# Patient Record
Sex: Male | Born: 1940 | Race: White | Hispanic: No | Marital: Married | Smoking: Former smoker
Health system: Southern US, Community
[De-identification: ages and names within clinical notes are randomized; demographics above are authoritative.]

## PROBLEM LIST (undated history)

## (undated) DIAGNOSIS — I10 Essential (primary) hypertension: Secondary | ICD-10-CM

## (undated) DIAGNOSIS — C801 Malignant (primary) neoplasm, unspecified: Secondary | ICD-10-CM

## (undated) DIAGNOSIS — N329 Bladder disorder, unspecified: Secondary | ICD-10-CM

## (undated) DIAGNOSIS — E119 Type 2 diabetes mellitus without complications: Secondary | ICD-10-CM

## (undated) DIAGNOSIS — R569 Unspecified convulsions: Secondary | ICD-10-CM

## (undated) HISTORY — DX: Unspecified convulsions: R56.9

## (undated) HISTORY — PX: NOSE SURGERY: SHX723

## (undated) HISTORY — DX: Bladder disorder, unspecified: N32.9

## (undated) HISTORY — PX: CHOLECYSTECTOMY: SHX55

## (undated) HISTORY — DX: Malignant (primary) neoplasm, unspecified: C80.1

---

## 1999-07-01 ENCOUNTER — Other Ambulatory Visit: Admission: RE | Admit: 1999-07-01 | Discharge: 1999-07-01 | Payer: Self-pay | Admitting: Urology

## 2005-03-30 ENCOUNTER — Encounter: Admission: RE | Admit: 2005-03-30 | Discharge: 2005-03-30 | Payer: Self-pay | Admitting: Geriatric Medicine

## 2005-06-20 ENCOUNTER — Ambulatory Visit (HOSPITAL_COMMUNITY): Admission: RE | Admit: 2005-06-20 | Discharge: 2005-06-20 | Payer: Self-pay | Admitting: General Surgery

## 2005-06-20 ENCOUNTER — Encounter (INDEPENDENT_AMBULATORY_CARE_PROVIDER_SITE_OTHER): Payer: Self-pay | Admitting: *Deleted

## 2008-08-11 ENCOUNTER — Encounter: Admission: RE | Admit: 2008-08-11 | Discharge: 2008-08-11 | Payer: Self-pay | Admitting: Gastroenterology

## 2010-12-02 NOTE — Op Note (Signed)
NAME:  Kenneth Gonzalez, Kenneth Gonzalez NO.:  [id number]   MEDICAL RECORD NO.:  [id number]          PATIENT TYPE:  AMB   LOCATION:  SDS                          FACILITY:  [facility]   PHYSICIAN:  Adolph Pollack, M.D.DATE OF BIRTH:  Mar 13, 1941   DATE OF PROCEDURE:  06/20/2005  DATE OF DISCHARGE:                                 OPERATIVE REPORT   PREOP DIAGNOSIS:  Symptomatic cholelithiasis.   POSTOPERATIVE DIAGNOSIS:  Symptomatic cholelithiasis.   PROCEDURE:  Laparoscopic cholecystectomy with intraoperative cholangiogram.   SURGEON:  Adolph Pollack, M.D.   ASSISTANT:  Gita Kudo, M.D.   ANESTHESIA:  General.   INDICATIONS:  This 70 year old male was having these nagging-type of right  upper quadrant pains that have persisted. Initially he thought it just  related to where his dog jumped on him many months ago, but he is still  having the pains. He has gallstones on ultrasound and now presents for  elective laparoscopic cholecystectomy. The procedure and the risks were  discussed with him preoperative.   TECHNIQUE:  He was seen in the holding area and then brought to the  operating room, placed supine on the operating table, and a general  anesthetic was administered. The hair on the abdominal wall was clipped, and  the area was sterilely prepped and draped. A dilute Marcaine solution was  infiltrated in the subumbilical region; and a subumbilical incision was made  through the skin, subcutaneous tissue, fascia, and peritoneum; entering the  peritoneal cavity under direct vision. A pursestring suture of #0 Vicryl was  placed around the fascial edges. A Hassan trocar was introduced into the  peritoneal cavity and a pneumoperitoneum was created by insufflation of CO2  gas.   Next. laparoscope was introduced and he was placed in the reverse  Trendelenburg position with the right side tilted slightly up. An 11-mm  trocar was placed in the epigastric incision and two  5-mm trocars were  placed in the right mid lateral abdomen. The fundus of the gallbladder was  grasped. There were no significant adhesions between the gallbladder, the  omentum, or duodenum. It was able to be retracted toward the right shoulder.  The infundibulum was grasped using blunt dissection, staying on the  gallbladder  The infundibulum was mobilized. I identified the cystic duct  and watched it as it tapered down from the neck of the gallbladder. I  created a window around it. It was clipped at the cystic duct gallbladder  junction and a small incision made in the cystic duct. A cholangiocatheter  was passed through the anterior abdominal wall and placed into the cystic  duct and a cholangiogram was performed.   Under real time fluoroscopy, dilute contrast material was injected into the  cystic duct. The common hepatic, right-and-left hepatic, and common bile  ducts were all opacified fairly promptly and contrast rained into the  duodenum promptly without obvious evidence of obstruction. Final report is  pending the radiologist interpretation.   Following this, I removed the cholangiocatheter and clipped the cystic three  times proximally, and it was divided. I identified an anterior and  posterior  branch of the cystic artery and created windows around these. These were  clipped and divided. I then dissected the gallbladder free from the liver  using electrocautery. A small puncture wound was made in the gallbladder and  small leakage of bile occurred, but I was able to evacuate this. The  gallbladder was placed in the Endopouch bag.   The gallbladder fossa was irrigated copiously with saline solution. The  bleeding points were controlled with electrocautery. It was then hemostatic  without evidence of bile leak, and I evacuated as much of the irrigation  fluid as possible.   The gallbladder was then removed through the subumbilical port. The  subumbilical fascial defect was  closed by tightening up and tying down the  pursestring suture. The remaining trocars were removed and the  pneumoperitoneum was released. The skin incisions were closed with 4-0  Monocryl subcuticular stitches followed by Steri-Strips and sterile  dressings. He tolerated the procedure well without any apparent  complications.  He was taken to the recovery room in satisfactory condition.      Adolph Pollack, M.D.  Electronically Signed     TJR/MEDQ  D:  06/20/2005  T:  06/20/2005  Job:  [id number]   cc:   Hal T. Stoneking, M.D.  Fax: [phone]

## 2013-04-18 ENCOUNTER — Ambulatory Visit (INDEPENDENT_AMBULATORY_CARE_PROVIDER_SITE_OTHER): Payer: Medicare Other

## 2013-04-18 VITALS — BP 127/71 | HR 68 | Resp 16 | Ht 66.0 in | Wt 215.0 lb

## 2013-04-18 DIAGNOSIS — M79609 Pain in unspecified limb: Secondary | ICD-10-CM

## 2013-04-18 DIAGNOSIS — M779 Enthesopathy, unspecified: Secondary | ICD-10-CM

## 2013-04-18 DIAGNOSIS — M79672 Pain in left foot: Secondary | ICD-10-CM

## 2013-04-18 DIAGNOSIS — M19079 Primary osteoarthritis, unspecified ankle and foot: Secondary | ICD-10-CM

## 2013-04-18 DIAGNOSIS — M19072 Primary osteoarthritis, left ankle and foot: Secondary | ICD-10-CM

## 2013-04-18 NOTE — Patient Instructions (Signed)
ICE INSTRUCTIONS  Apply ice or cold pack to the affected area at least 3 times a day for 10-15 minutes each time.  You should also use ice after prolonged activity or vigorous exercise.  Do not apply ice longer than 20 minutes at one time.  Always keep a cloth between your skin and the ice pack to prevent burns.  Being consistent and following these instructions will help control your symptoms.  We suggest you purchase a gel ice pack because they are reusable and do bit leak.  Some of them are designed to wrap around the area.  Use the method that works best for you.  Here are some other suggestions for icing.   Use a frozen bag of peas or corn-inexpensive and molds well to your body, usually stays frozen for 10 to 20 minutes.  Wet a towel with cold water and squeeze out the excess until it's damp.  Place in a bag in the freezer for 20 minutes. Then remove and use.    Alternate applying the warm or hot moist towel for 10 minutes followed by a ice pack for 10-15 minutes to the affected area. Repeat this process 2 or 3 times once or twice daily

## 2013-04-18 NOTE — Progress Notes (Signed)
  Subjective:    Patient ID: Kenneth Gonzalez, male    DOB: 18-Sep-1940, 72 y.o.   MRN: [id number]  HPI Comments: Patient has been wearing custom molded orthotics dispensed on March 07, 2013. He states he is unsure if the orthotics are causing this new pain on the lateral side of the left foot.  Foot Pain This is a new problem. The current episode started 1 to 4 weeks ago. The problem occurs daily. The problem has been gradually worsening. The symptoms are aggravated by walking and standing (hard surfaces). He has tried NSAIDs for the symptoms. The treatment provided mild relief.      Review of Systems  Musculoskeletal: Positive for back pain and gait problem.       Hip pain  All other systems reviewed and are negative.       Objective:   Physical Exam  Constitutional: He is oriented to person, place, and time. He appears well-developed and well-nourished.  HENT:  Head: Normocephalic and atraumatic.  Cardiovascular: Intact distal pulses.   Pulses:      Dorsalis pedis pulses are 2+ on the right side, and 2+ on the left side.       Posterior tibial pulses are 1+ on the right side, and 1+ on the left side.  Skin texture and turgor unremarkable temperature warm, capillary refill time 3-4 seconds all digits, no excessive edema or varicosity noted  Musculoskeletal:       Left foot: He exhibits bony tenderness.  Patient describes tenderness and pain over the fifth metatarsal base and cuboid articulation dorsolateral left midfoot, although no acute injury was noted. Patient does have a history of old ankle injury or sprain which may have affected the metatarsus joint and Lisfranc joint. X-rays demonstrate some asymmetric joint space narrowing no signs of fracture were identified. 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 Patient continues to wear her orthoses with a pocketed first metatarsal  Neurological: He  is alert and oriented to person, place, and time. He has normal strength and normal reflexes. No sensory deficit.  Epicritic and proprioceptive sensations intact muscle strength intact and normal, patient does have some clawfo and almost spastic contracture of the toes notedot digital contractures  Skin: Skin is warm and dry. No cyanosis. Nails show no clubbing.  Hair growth diminished to absent nails slightly criptotic with discoloration. No open wounds abrasions or lacerations were noted. Skin color and pigment normal  Psychiatric: He has a normal mood and affect. His behavior is normal. Judgment and thought content normal.          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 His current shoes are more than 73 years old and show fairly heavy wear. No external edema or erythema or identified and the site. Recommendation at this time is for warm compress ice pack, continue with Mobic or meloxicam NSAID therapy. Maintain use of orthoses. Recommended new shoes with a steel shank and thick soled, patient prefers hightop type working boot or hiking boot which would be preferred. Discussed the possibility of a steroid injection which will be reintroduced if no improvement within a month. Followup visit one month  Alvan Dame DPM

## 2013-12-03 ENCOUNTER — Ambulatory Visit (INDEPENDENT_AMBULATORY_CARE_PROVIDER_SITE_OTHER): Payer: Medicare Other

## 2013-12-03 VITALS — BP 115/70 | HR 78 | Resp 18

## 2013-12-03 DIAGNOSIS — R269 Unspecified abnormalities of gait and mobility: Secondary | ICD-10-CM

## 2013-12-03 DIAGNOSIS — M79672 Pain in left foot: Secondary | ICD-10-CM

## 2013-12-03 DIAGNOSIS — M779 Enthesopathy, unspecified: Secondary | ICD-10-CM

## 2013-12-03 DIAGNOSIS — M79609 Pain in unspecified limb: Secondary | ICD-10-CM

## 2013-12-03 NOTE — Progress Notes (Signed)
   Subjective:    Patient ID: MAVEN VARELAS, male    DOB: 02/03/1941, 73 y.o.   MRN: [id number]  HPI I had a pair of shoes that would throw me off to the side of both my feet and the shoes I have now does not do that and I think the inserts my need to be ajusted at the back near the heels    Review of Systems no systemic changes or findings noted     Objective:   Physical Exam 73 year old white male process this time for followup well-developed well-nourished white [place] and these have some difficulty with his gait indicates his foot is rolling in the swelling is ankles his right parascapular shoes previously tried new balance did elect those either and there were too expensive for sketchers or broken down rolling in laterally on lateral side she was collapsed and it was demonstrated patient shoes are broken down and have to softer the outer sole. The orthotics if she fit and contour well her patient continued to have some Complications patient has a forefoot valgus deformity of the plantar flexed metatarsal others pocketing for the metatarsal he still rolling laterally and I suggested possibly an additional 3 rear foot valgus post is applied to the orthotics and suggested knee shoes such as a new balance or Brooks shoe with a stability or motion control shoe which can be obtained at [employer] or [employer].       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

## 2013-12-03 NOTE — Patient Instructions (Signed)
Recommend a firm stable shoe such as a Brooks or new balance shoe which can be obtained either at home a sports or often running shoe store which now goes by [employer]

## 2014-09-01 ENCOUNTER — Other Ambulatory Visit: Payer: Self-pay | Admitting: Geriatric Medicine

## 2014-09-01 DIAGNOSIS — Z87891 Personal history of nicotine dependence: Secondary | ICD-10-CM

## 2014-09-08 ENCOUNTER — Ambulatory Visit
Admission: RE | Admit: 2014-09-08 | Discharge: 2014-09-08 | Disposition: A | Discharge status: Home / Self Care | Payer: Medicare Other | Source: Ambulatory Visit | Attending: Geriatric Medicine | Admitting: Geriatric Medicine

## 2014-09-08 DIAGNOSIS — Z87891 Personal history of nicotine dependence: Secondary | ICD-10-CM

## 2017-05-14 ENCOUNTER — Ambulatory Visit (INDEPENDENT_AMBULATORY_CARE_PROVIDER_SITE_OTHER): Payer: Medicare Other | Admitting: Orthopaedic Surgery

## 2017-05-14 ENCOUNTER — Encounter (INDEPENDENT_AMBULATORY_CARE_PROVIDER_SITE_OTHER): Payer: Self-pay | Admitting: Orthopaedic Surgery

## 2017-05-14 ENCOUNTER — Ambulatory Visit (INDEPENDENT_AMBULATORY_CARE_PROVIDER_SITE_OTHER): Payer: Medicare Other

## 2017-05-14 VITALS — BP 135/78 | HR 77 | Resp 14 | Ht 69.0 in | Wt 225.0 lb

## 2017-05-14 DIAGNOSIS — M25512 Pain in left shoulder: Secondary | ICD-10-CM | POA: Diagnosis not present

## 2017-05-14 DIAGNOSIS — G8929 Other chronic pain: Secondary | ICD-10-CM | POA: Diagnosis not present

## 2017-05-14 MED ORDER — METHYLPREDNISOLONE ACETATE 40 MG/ML IJ SUSP
80.0000 mg | INTRAMUSCULAR | Status: AC | PRN
  Administered 2017-05-14: 80 mg

## 2017-05-14 MED ORDER — LIDOCAINE HCL 1 % IJ SOLN
2.0000 mL | INTRAMUSCULAR | Status: AC | PRN
  Administered 2017-05-14: 2 mL

## 2017-05-14 MED ORDER — BUPIVACAINE HCL 0.5 % IJ SOLN
2.0000 mL | INTRAMUSCULAR | Status: AC | PRN
  Administered 2017-05-14: 2 mL via INTRA_ARTICULAR

## 2017-05-14 NOTE — Progress Notes (Signed)
Office Visit Note   Patient: Kenneth Gonzalez           Date of Birth: 05/16/1941           MRN: [id number] Visit Date: 05/14/2017              Requested by: Lajean Manes, MD [address] [place], [place] [address] PCP: Lajean Manes, MD   Assessment & Plan: Visit Diagnoses:  1. Chronic left shoulder pain     Plan: Impingement syndrome with possible rotator cuff tear. Long discussion regarding possible diagnosis and treatment options. We'll start with a subacromial cortisone injection and monitor  response  Follow-Up Instructions: No Follow-up on file.   Orders:  Orders Placed This Encounter  Procedures  . XR Shoulder Left   No orders of the defined types were placed in this encounter.     Procedures: Large Joint Inj Date/Time: 05/14/2017 1:20 PM Performed by: Garald Balding Authorized by: Garald Balding   Consent Given by:  Patient Timeout: prior to procedure the correct patient, procedure, and site was verified   Indications:  Pain Location:  Shoulder Site:  L subacromial bursa Prep: patient was prepped and draped in usual sterile fashion   Needle Size:  25 G Needle Length:  1.5 inches Approach:  Lateral Ultrasound Guidance: No   Fluoroscopic Guidance: No   Arthrogram: No   Medications:  80 mg methylPREDNISolone acetate 40 MG/ML; 2 mL lidocaine 1 %; 2 mL bupivacaine 0.5 % Aspiration Attempted: No   Patient tolerance:  Patient tolerated the procedure well with no immediate complications     Clinical Data: No additional findings.   Subjective: Chief Complaint  Patient presents with  . Left Shoulder - Pain, Numbness    Kenneth Gonzalez is a 76 y o that is here for Left shoulder pain x 3 months.  Insidious onset of left shoulder pain without injury or trauma. Now having difficulty raising his arm over his head and sleeping on that side. No numbness or tingling. Pain is localized to the shoulder. No neck discomfort. No ecchymosis or  induration.  HPI  Review of Systems  Constitutional: Negative for fatigue.  HENT: Negative for hearing loss.   Respiratory: Negative for apnea, chest tightness and shortness of breath.   Cardiovascular: Negative for chest pain, palpitations and leg swelling.  Gastrointestinal: Negative for blood in stool, constipation and diarrhea.  Genitourinary: Negative for difficulty urinating.  Musculoskeletal: Positive for back pain. Negative for arthralgias, joint swelling, myalgias, neck pain and neck stiffness.  Neurological: Negative for weakness, numbness and headaches.  Hematological: Does not bruise/bleed easily.  Psychiatric/Behavioral: Negative for sleep disturbance. The patient is not nervous/anxious.      Objective: Vital Signs: BP 135/78   Pulse 77   Resp 14   Ht 5\' 9"  (1.753 m)   Wt 225 lb (102.1 kg)   BMI 33.23 kg/m   Physical Exam  Ortho Exam left shoulder with positive impingement tickly with external rotation. Positive empty can testing. Biceps intact. Skin intact. Good grip and release. Neurovascular exam intact. Able to place arm fully overhead with her circuitous range of motion and painful arc  Specialty Comments:  No specialty comments available.  Imaging: Xr Shoulder Left  Result Date: 05/14/2017 Films of the left shoulder obtained in several projections. The humeral head is centered about the glenoid. No ectopic calcification. Normal space between the humeral head and the acromion. Mild to moderate degenerative changes at the acromioclavicular  joint with this cystic changes on both sides. Type I acromion    PMFS History: There are no active problems to display for this patient.  Past Medical History:  Diagnosis Date  . Bladder problem   . Cancer ([place])    skin  . Seizures ([place])     History reviewed. No pertinent family history.  Past Surgical History:  Procedure Laterality Date  . CHOLECYSTECTOMY    . NOSE SURGERY     Social History    Occupational History  . Not on file.   Social History Main Topics  . Smoking status: Former Research scientist (life sciences)  . Smokeless tobacco: Never Used  . Alcohol use No  . Drug use: No  . Sexual activity: Not on file

## 2017-05-28 ENCOUNTER — Ambulatory Visit (INDEPENDENT_AMBULATORY_CARE_PROVIDER_SITE_OTHER): Payer: Medicare Other | Admitting: Orthopaedic Surgery

## 2017-09-17 ENCOUNTER — Other Ambulatory Visit: Payer: Self-pay

## 2017-09-17 ENCOUNTER — Encounter (HOSPITAL_COMMUNITY): Payer: Self-pay

## 2017-09-17 ENCOUNTER — Emergency Department (HOSPITAL_COMMUNITY)
Admission: EM | Admit: 2017-09-17 | Discharge: 2017-09-17 | Disposition: A | Discharge status: Home / Self Care | Payer: Medicare Other | Attending: Emergency Medicine | Admitting: Emergency Medicine

## 2017-09-17 ENCOUNTER — Emergency Department (HOSPITAL_COMMUNITY): Payer: Medicare Other

## 2017-09-17 DIAGNOSIS — Z87891 Personal history of nicotine dependence: Secondary | ICD-10-CM | POA: Insufficient documentation

## 2017-09-17 DIAGNOSIS — Z85828 Personal history of other malignant neoplasm of skin: Secondary | ICD-10-CM | POA: Insufficient documentation

## 2017-09-17 DIAGNOSIS — R0789 Other chest pain: Secondary | ICD-10-CM | POA: Insufficient documentation

## 2017-09-17 DIAGNOSIS — Z79899 Other long term (current) drug therapy: Secondary | ICD-10-CM | POA: Diagnosis not present

## 2017-09-17 DIAGNOSIS — R079 Chest pain, unspecified: Secondary | ICD-10-CM | POA: Diagnosis present

## 2017-09-17 LAB — BASIC METABOLIC PANEL
ANION GAP: 12 (ref 5–15)
BUN: 12 mg/dL (ref 6–20)
CALCIUM: 9.9 mg/dL (ref 8.9–10.3)
CO2: 27 mmol/L (ref 22–32)
Chloride: 102 mmol/L (ref 101–111)
Creatinine, Ser: 0.96 mg/dL (ref 0.61–1.24)
GFR calc Af Amer: >60 mL/min (ref >60)
GFR calc non Af Amer: >60 mL/min (ref >60)
GLUCOSE: 125 mg/dL — AB (ref 65–99)
Potassium: 4.1 mmol/L (ref 3.5–5.1)
Sodium: 141 mmol/L (ref 135–145)

## 2017-09-17 LAB — CBC
HEMATOCRIT: 43.3 % (ref 39.0–52.0)
HEMOGLOBIN: 14.7 g/dL (ref 13.0–17.0)
MCH: 29.7 pg (ref 26.0–34.0)
MCHC: 33.9 g/dL (ref 30.0–36.0)
MCV: 87.5 fL (ref 78.0–100.0)
Platelets: 252 10*3/uL (ref 150–400)
RBC: 4.95 MIL/uL (ref 4.22–5.81)
RDW: 12.9 % (ref 11.5–15.5)
WBC: 8.1 10*3/uL (ref 4.0–10.5)

## 2017-09-17 LAB — I-STAT TROPONIN, ED
TROPONIN I, POC: 0 ng/mL (ref 0.00–0.08)
Troponin i, poc: 0 ng/mL (ref 0.00–0.08)

## 2017-09-17 LAB — D-DIMER, QUANTITATIVE (NOT AT ARMC): D DIMER QUANT: 0.27 ug{FEU}/mL (ref 0.00–0.50)

## 2017-09-17 NOTE — ED Triage Notes (Signed)
Patient complains of sharp CP that started last night and got some relief with tums. Reports that this am the pain returned, no associated symptoms, no OSB

## 2017-09-17 NOTE — Discharge Instructions (Signed)
Avoid any strenuous activity or heavy lifting with your right arm. Take tylenol or motrin for pain. Follow up with family doctor as needed. Return if worsening.

## 2017-09-17 NOTE — ED Provider Notes (Signed)
[facility] EMERGENCY DEPARTMENT Provider Note   CSN: [id number] Arrival date & time: 09/17/17  1258     History   Chief Complaint No chief complaint on file.   HPI Kenneth Gonzalez is a 77 y.o. male.  HPI Kenneth Gonzalez is a 77 y.o. male with history of high cholesterol, seizures, presents to emergency department complaining of right-sided chest pain.  Patient states his pain began yesterday.  He states pain comes and goes in severity, at times it sharp, at times it is dull.  It is worsened by palpation of the chest and taking deep breaths.  He denies any exertional chest pain or shortness of breath.  Denies any dizziness or lightheadedness.  No nausea or vomiting.  Pain is not worsened with eating.  He states he tried some Tums last night and it did seem to help.  Denies any strenuous activity or heavy lifting with the right arm.  Patient is left-handed.  No recent travel or surgeries.  No history of cardiac problems or blood clots.  Pain does not radiate.  Past Medical History:  Diagnosis Date  . Bladder problem   . Cancer ([place])    skin  . Seizures ([place])     There are no active problems to display for this patient.   Past Surgical History:  Procedure Laterality Date  . CHOLECYSTECTOMY    . NOSE SURGERY         Home Medications    Prior to Admission medications   Medication Sig Start Date End Date Taking? Authorizing Provider  Fexofenadine HCl (ALLEGRA PO) Take by mouth.    [provider]  Glucosamine-Chondroit-Vit C-Mn (GLUCOSAMINE CHONDR 1500 COMPLX PO) Take by mouth.    [provider]  meloxicam (MOBIC) 15 MG tablet Take 15 mg by mouth daily.    Harriet Masson, DPM  metFORMIN (GLUCOPHAGE-XR) 500 MG 24 hr tablet  12/01/13   [provider]  phenytoin (DILANTIN) 100 MG ER capsule Take 100 mg by mouth 3 (three) times daily.    [provider]  pravastatin (PRAVACHOL) 10 MG tablet  11/05/13   [provider]    Family History No family history on file.  Social History Social History   Tobacco Use  . Smoking status: Former Research scientist (life sciences)  . Smokeless tobacco: Never Used  Substance Use Topics  . Alcohol use: No  . Drug use: No     Allergies   Patient has no known allergies.   Review of Systems Review of Systems  Constitutional: Negative for chills and fever.  Respiratory: Negative for cough, chest tightness and shortness of breath.   Cardiovascular: Positive for chest pain. Negative for palpitations and leg swelling.  Gastrointestinal: Negative for abdominal distention, abdominal pain, diarrhea, nausea and vomiting.  Genitourinary: Negative for dysuria, frequency, hematuria and urgency.  Musculoskeletal: Negative for arthralgias, myalgias, neck pain and neck stiffness.  Skin: Negative for rash.  Allergic/Immunologic: Negative for immunocompromised state.  Neurological: Negative for dizziness, weakness, light-headedness, numbness and headaches.  All other systems reviewed and are negative.    Physical Exam Updated Vital Signs BP (!) 149/86   Pulse 77   Temp 98.1 F (36.7 C) (Oral)   Resp 18   SpO2 98%   Physical Exam  Constitutional: He appears well-developed and well-nourished. No distress.  HENT:  Head: Normocephalic and atraumatic.  Eyes: Conjunctivae are normal.  Neck: Neck supple.  Cardiovascular: Normal rate, regular rhythm and normal heart sounds.  Pulmonary/Chest:  Effort normal. No respiratory distress. He has no wheezes. He has no rales. He exhibits tenderness.  TTp over right chest wall. No rashes, bruising, discoloraiton    Abdominal: Soft. Bowel sounds are normal. He exhibits no distension. There is no tenderness. There is no rebound.  Musculoskeletal: He exhibits no edema.  Neurological: He is alert.  Skin: Skin is warm and dry.  Nursing note and vitals reviewed.    ED Treatments / Results  Labs (all labs ordered are listed, but only  abnormal results are displayed) Labs Reviewed  BASIC METABOLIC PANEL - Abnormal; Notable for the following components:      Result Value   Glucose, Bld 125 (*)    All other components within normal limits  CBC  D-DIMER, QUANTITATIVE (NOT AT [facility])  I-STAT TROPONIN, ED  I-STAT TROPONIN, ED  I-STAT TROPONIN, ED    EKG  EKG Interpretation  Date/Time:  Monday September 17 2017 13:06:50 EST Ventricular Rate:  79 PR Interval:  176 QRS Duration: 98 QT Interval:  408 QTC Calculation: 467 R Axis:   60 Text Interpretation:  Normal sinus rhythm Normal ECG Confirmed by Davonna Belling [phone]) on 09/17/2017 8:44:17 PM       Radiology Dg Chest 2 View  Result Date: 09/17/2017 CLINICAL DATA:  Chest pain starting last night radiating to RIGHT arm. Former smoker. EXAM: CHEST  2 VIEW COMPARISON:  Chest radiograph June 08, 2017 FINDINGS: Cardiomediastinal silhouette is normal. No pleural effusions or focal consolidations. LEFT midlung zone scarring. Trachea projects midline and there is no pneumothorax. Soft tissue planes and included osseous structures are non-suspicious. Mild degenerative change of the thoracic spine. Surgical clips in the included right abdomen compatible with cholecystectomy. Mild RIGHT diaphragmatic hepatic eventration. IMPRESSION: Stable examination: No acute cardiopulmonary process. Electronically Signed   By: Elon Alas M.D.   On: 09/17/2017 15:08    Procedures Procedures (including critical care time)  Medications Ordered in ED Medications - No data to display   Initial Impression / Assessment and Plan / ED Course  I have reviewed the triage vital signs and the nursing notes.  Pertinent labs & imaging results that were available during my care of the patient were reviewed by me and considered in my medical decision making (see chart for details).     Pt with right sided chest pain, reproducible with deep breathing and palpation of the chest wall. VS normal.  Pt in NAD. Labs obtained in triage 6 hrs ago, normal CBC, BMET, trop. ECG normal. CXR normal. Will add  A d dimer and delta trop.   9:48 PM Second troponin is 0.00.  D-dimer is negative at 0.27.  Patient is in no acute distress, patient states he just remembered that he sleeps on the right side and states it is worse when he is laying on that side at this time.  His pain again is reproducible with palpation of the right side of the chest.  He is not short of breath.  No widened mediastinum on the x-ray, do not think this is a dissection.  Very atypical pain for ACS.  Plan to discharge home, advised to take Tylenol or Motrin for the pain, and follow-up with family doctor closely.  Return precautions discussed.  Vitals:   09/17/17 1306 09/17/17 1550 09/17/17 1824  BP: (!) 174/90 (!) 154/80 (!) 149/86  Pulse: 78 70 77  Resp: 18 18 18   Temp: 98.2 F (36.8 C) 98 F (36.7 C) 98.1 F (36.7 C)  TempSrc:  Oral Oral Oral  SpO2: 99% 98% 98%     Final Clinical Impressions(s) / ED Diagnoses   Final diagnoses:  Chest wall pain    ED Discharge Orders    None       Janee Morn 09/17/17 2149    Davonna Belling, MD 09/17/17 2255

## 2017-11-19 ENCOUNTER — Ambulatory Visit (INDEPENDENT_AMBULATORY_CARE_PROVIDER_SITE_OTHER): Payer: Self-pay

## 2017-11-19 ENCOUNTER — Ambulatory Visit (INDEPENDENT_AMBULATORY_CARE_PROVIDER_SITE_OTHER): Payer: Medicare Other | Admitting: Orthopaedic Surgery

## 2017-11-19 ENCOUNTER — Encounter (INDEPENDENT_AMBULATORY_CARE_PROVIDER_SITE_OTHER): Payer: Self-pay | Admitting: Orthopaedic Surgery

## 2017-11-19 VITALS — BP 128/74 | HR 75 | Resp 17 | Ht 66.0 in | Wt 205.0 lb

## 2017-11-19 DIAGNOSIS — M545 Low back pain: Secondary | ICD-10-CM | POA: Diagnosis not present

## 2017-11-19 DIAGNOSIS — M25551 Pain in right hip: Secondary | ICD-10-CM | POA: Diagnosis not present

## 2017-11-19 NOTE — Patient Instructions (Signed)

## 2017-11-19 NOTE — Progress Notes (Signed)
Office Visit Note   Patient: Kenneth Gonzalez           Date of Birth: [birth year]           MRN: [id number] Visit Date: 11/19/2017              Requested by: Lajean Manes, MD [address] [place], [place] [address] PCP: Lajean Manes, MD   Assessment & Plan: Visit Diagnoses:  1. Pain in right hip     Plan: Right hip pain etiology appears to be from the lumbar spine where there is diffuse degenerative changes.  Presently feeling much better.  We will give him a set of lumbar spine exercises.  Consider over-the-counter medicines.  Office if he has an exacerbation.  Follow-Up Instructions: No follow-ups on file.   Orders:  Orders Placed This Encounter  Procedures  . XR HIP UNILAT W OR W/O PELVIS 2-3 VIEWS RIGHT  . XR Lumbar Spine 2-3 Views   No orders of the defined types were placed in this encounter.     Procedures: No procedures performed   Clinical Data: No additional findings.   Subjective: Chief Complaint  Patient presents with  . Right Hip - Pain  Mr. Gause had an episode last week with right paralumbar pain radiating to his buttock and the lateral aspect of his right thigh.  No history of injury or trauma.  He did try some over-the-counter medicines and some "stretching" he is now feeling much better.  Not having any groin pain.  Notes that he is not walking with a limp.  HPI  Review of Systems  Constitutional: Negative for fatigue and fever.  HENT: Negative for sneezing and sore throat.   Eyes: Negative for pain.  Respiratory: Negative for shortness of breath.   Cardiovascular: Negative for chest pain.  Gastrointestinal: Negative for abdominal pain and constipation.  Endocrine: Negative for polyuria.  Genitourinary: Negative for flank pain.  Musculoskeletal: Positive for gait problem. Negative for joint swelling.  Skin: Negative for rash.  Allergic/Immunologic: Negative for food allergies.  Neurological: Negative for dizziness and  headaches.  Hematological: Does not bruise/bleed easily.  Psychiatric/Behavioral: Negative for confusion. The patient is not nervous/anxious.      Objective: Vital Signs: BP 128/74 (BP Location: Left Arm, Patient Position: Sitting, Cuff Size: Normal)   Pulse 75   Resp 17   Ht 5\' 6"  (1.676 m)   Wt 205 lb (93 kg) Comment: per patient  BMI 33.09 kg/m   Physical Exam  Constitutional: He is oriented to person, place, and time. He appears well-developed and well-nourished.  HENT:  Mouth/Throat: Oropharynx is clear and moist.  Eyes: Pupils are equal, round, and reactive to light. EOM are normal.  Pulmonary/Chest: Effort normal.  Neurological: He is alert and oriented to person, place, and time.  Skin: Skin is warm and dry.  Psychiatric: He has a normal mood and affect. His behavior is normal.    Ortho Exam awake alert and oriented x3.  Comfortable sitting.  Easily able to get up and down from a sitting position to walk without a limp.  Some mild tenderness in the right paralumbar region.  No pain with range of motion of the groin.  Vascular exam intact.  No pain around the lateral aspect of his right hip.  Straight leg raise negative bilaterally.  Motor exam intact  Specialty Comments:  No specialty comments available.  Imaging: Xr Hip Unilat W Or W/o Pelvis 2-3 Views Right  Result Date: 11/19/2017 AP the pelvis demonstrated no significant arthritis of either hip.  Sacroiliac joints appear to be intact.  Xr Lumbar Spine 2-3 Views  Result Date: 11/19/2017 2 views of the lumbar spine were obtained in the AP and lateral projections.  Spine is straight there is some bony demineralization.  Lateral view reveals significant degenerative changes throughout the lumbar spine with spurring anteriorly and calcification of the anterior longitudinal ligament.  Mild calcification of the abdominal aorta.  Mild decrease in the disc space between L5 and S1.  Facet sclerosis at L4-5 and L5-S1.  No  listhesis.    PMFS History: There are no active problems to display for this patient.  Past Medical History:  Diagnosis Date  . Bladder problem   . Cancer ([place])    skin  . Seizures ([place])     History reviewed. No pertinent family history.  Past Surgical History:  Procedure Laterality Date  . CHOLECYSTECTOMY    . NOSE SURGERY     Social History   Occupational History  . Not on file  Tobacco Use  . Smoking status: Former Research scientist (life sciences)  . Smokeless tobacco: Never Used  Substance and Sexual Activity  . Alcohol use: No  . Drug use: No  . Sexual activity: Not on file

## 2019-12-30 DIAGNOSIS — J3089 Other allergic rhinitis: Secondary | ICD-10-CM | POA: Diagnosis not present

## 2019-12-30 DIAGNOSIS — R519 Headache, unspecified: Secondary | ICD-10-CM | POA: Diagnosis not present

## 2019-12-30 DIAGNOSIS — M542 Cervicalgia: Secondary | ICD-10-CM | POA: Diagnosis not present

## 2019-12-30 DIAGNOSIS — R030 Elevated blood-pressure reading, without diagnosis of hypertension: Secondary | ICD-10-CM | POA: Diagnosis not present

## 2020-02-09 DIAGNOSIS — E1142 Type 2 diabetes mellitus with diabetic polyneuropathy: Secondary | ICD-10-CM | POA: Diagnosis not present

## 2020-02-09 DIAGNOSIS — Z7984 Long term (current) use of oral hypoglycemic drugs: Secondary | ICD-10-CM | POA: Diagnosis not present

## 2020-02-09 DIAGNOSIS — I10 Essential (primary) hypertension: Secondary | ICD-10-CM | POA: Diagnosis not present

## 2020-02-09 DIAGNOSIS — R0789 Other chest pain: Secondary | ICD-10-CM | POA: Diagnosis not present

## 2020-03-16 DIAGNOSIS — Z7984 Long term (current) use of oral hypoglycemic drugs: Secondary | ICD-10-CM | POA: Diagnosis not present

## 2020-03-16 DIAGNOSIS — E1142 Type 2 diabetes mellitus with diabetic polyneuropathy: Secondary | ICD-10-CM | POA: Diagnosis not present

## 2020-03-16 DIAGNOSIS — I10 Essential (primary) hypertension: Secondary | ICD-10-CM | POA: Diagnosis not present

## 2020-03-16 DIAGNOSIS — E1169 Type 2 diabetes mellitus with other specified complication: Secondary | ICD-10-CM | POA: Diagnosis not present

## 2020-04-26 DIAGNOSIS — I10 Essential (primary) hypertension: Secondary | ICD-10-CM | POA: Diagnosis not present

## 2020-04-26 DIAGNOSIS — Z23 Encounter for immunization: Secondary | ICD-10-CM | POA: Diagnosis not present

## 2020-04-26 DIAGNOSIS — Z79899 Other long term (current) drug therapy: Secondary | ICD-10-CM | POA: Diagnosis not present

## 2020-04-26 DIAGNOSIS — E1142 Type 2 diabetes mellitus with diabetic polyneuropathy: Secondary | ICD-10-CM | POA: Diagnosis not present

## 2020-04-26 DIAGNOSIS — Z7984 Long term (current) use of oral hypoglycemic drugs: Secondary | ICD-10-CM | POA: Diagnosis not present

## 2020-04-26 DIAGNOSIS — E1169 Type 2 diabetes mellitus with other specified complication: Secondary | ICD-10-CM | POA: Diagnosis not present

## 2020-07-13 DIAGNOSIS — I10 Essential (primary) hypertension: Secondary | ICD-10-CM | POA: Diagnosis not present

## 2020-07-13 DIAGNOSIS — E1142 Type 2 diabetes mellitus with diabetic polyneuropathy: Secondary | ICD-10-CM | POA: Diagnosis not present

## 2020-07-13 DIAGNOSIS — Z79899 Other long term (current) drug therapy: Secondary | ICD-10-CM | POA: Diagnosis not present

## 2020-07-13 DIAGNOSIS — K521 Toxic gastroenteritis and colitis: Secondary | ICD-10-CM | POA: Diagnosis not present

## 2020-07-13 DIAGNOSIS — Z7984 Long term (current) use of oral hypoglycemic drugs: Secondary | ICD-10-CM | POA: Diagnosis not present

## 2020-10-25 DIAGNOSIS — E1169 Type 2 diabetes mellitus with other specified complication: Secondary | ICD-10-CM | POA: Diagnosis not present

## 2020-10-25 DIAGNOSIS — Z79899 Other long term (current) drug therapy: Secondary | ICD-10-CM | POA: Diagnosis not present

## 2020-10-25 DIAGNOSIS — G40909 Epilepsy, unspecified, not intractable, without status epilepticus: Secondary | ICD-10-CM | POA: Diagnosis not present

## 2020-10-25 DIAGNOSIS — E782 Mixed hyperlipidemia: Secondary | ICD-10-CM | POA: Diagnosis not present

## 2020-10-25 DIAGNOSIS — E1142 Type 2 diabetes mellitus with diabetic polyneuropathy: Secondary | ICD-10-CM | POA: Diagnosis not present

## 2020-10-25 DIAGNOSIS — I10 Essential (primary) hypertension: Secondary | ICD-10-CM | POA: Diagnosis not present

## 2020-10-25 DIAGNOSIS — Z0000 Encounter for general adult medical examination without abnormal findings: Secondary | ICD-10-CM | POA: Diagnosis not present

## 2020-10-25 DIAGNOSIS — Z1389 Encounter for screening for other disorder: Secondary | ICD-10-CM | POA: Diagnosis not present

## 2020-10-28 DIAGNOSIS — H353131 Nonexudative age-related macular degeneration, bilateral, early dry stage: Secondary | ICD-10-CM | POA: Diagnosis not present

## 2020-10-28 DIAGNOSIS — E119 Type 2 diabetes mellitus without complications: Secondary | ICD-10-CM | POA: Diagnosis not present

## 2020-10-28 DIAGNOSIS — H43813 Vitreous degeneration, bilateral: Secondary | ICD-10-CM | POA: Diagnosis not present

## 2020-10-28 DIAGNOSIS — H524 Presbyopia: Secondary | ICD-10-CM | POA: Diagnosis not present

## 2021-04-06 DIAGNOSIS — E1169 Type 2 diabetes mellitus with other specified complication: Secondary | ICD-10-CM | POA: Diagnosis not present

## 2021-04-06 DIAGNOSIS — I10 Essential (primary) hypertension: Secondary | ICD-10-CM | POA: Diagnosis not present

## 2021-04-06 DIAGNOSIS — E1142 Type 2 diabetes mellitus with diabetic polyneuropathy: Secondary | ICD-10-CM | POA: Diagnosis not present

## 2021-04-06 DIAGNOSIS — Z23 Encounter for immunization: Secondary | ICD-10-CM | POA: Diagnosis not present

## 2021-05-18 DIAGNOSIS — L821 Other seborrheic keratosis: Secondary | ICD-10-CM | POA: Diagnosis not present

## 2021-05-18 DIAGNOSIS — L820 Inflamed seborrheic keratosis: Secondary | ICD-10-CM | POA: Diagnosis not present

## 2021-05-18 DIAGNOSIS — D225 Melanocytic nevi of trunk: Secondary | ICD-10-CM | POA: Diagnosis not present

## 2021-05-18 DIAGNOSIS — L298 Other pruritus: Secondary | ICD-10-CM | POA: Diagnosis not present

## 2021-05-18 DIAGNOSIS — L814 Other melanin hyperpigmentation: Secondary | ICD-10-CM | POA: Diagnosis not present

## 2021-05-18 DIAGNOSIS — L570 Actinic keratosis: Secondary | ICD-10-CM | POA: Diagnosis not present

## 2021-05-18 DIAGNOSIS — L538 Other specified erythematous conditions: Secondary | ICD-10-CM | POA: Diagnosis not present

## 2021-05-18 DIAGNOSIS — D492 Neoplasm of unspecified behavior of bone, soft tissue, and skin: Secondary | ICD-10-CM | POA: Diagnosis not present

## 2021-06-16 DIAGNOSIS — Z03818 Encounter for observation for suspected exposure to other biological agents ruled out: Secondary | ICD-10-CM | POA: Diagnosis not present

## 2021-06-16 DIAGNOSIS — J069 Acute upper respiratory infection, unspecified: Secondary | ICD-10-CM | POA: Diagnosis not present

## 2021-10-05 ENCOUNTER — Emergency Department (HOSPITAL_COMMUNITY): Payer: Medicare PPO

## 2021-10-05 ENCOUNTER — Emergency Department (HOSPITAL_COMMUNITY)
Admission: EM | Admit: 2021-10-05 | Discharge: 2021-10-05 | Disposition: A | Discharge status: Home / Self Care | Payer: Medicare PPO | Attending: Emergency Medicine | Admitting: Emergency Medicine

## 2021-10-05 ENCOUNTER — Encounter (HOSPITAL_COMMUNITY): Payer: Self-pay | Admitting: Emergency Medicine

## 2021-10-05 ENCOUNTER — Other Ambulatory Visit: Payer: Self-pay

## 2021-10-05 DIAGNOSIS — E1142 Type 2 diabetes mellitus with diabetic polyneuropathy: Secondary | ICD-10-CM | POA: Diagnosis not present

## 2021-10-05 DIAGNOSIS — Z7984 Long term (current) use of oral hypoglycemic drugs: Secondary | ICD-10-CM | POA: Diagnosis not present

## 2021-10-05 DIAGNOSIS — R0789 Other chest pain: Secondary | ICD-10-CM

## 2021-10-05 DIAGNOSIS — E1169 Type 2 diabetes mellitus with other specified complication: Secondary | ICD-10-CM | POA: Diagnosis not present

## 2021-10-05 DIAGNOSIS — I10 Essential (primary) hypertension: Secondary | ICD-10-CM | POA: Insufficient documentation

## 2021-10-05 DIAGNOSIS — G40909 Epilepsy, unspecified, not intractable, without status epilepticus: Secondary | ICD-10-CM | POA: Diagnosis not present

## 2021-10-05 DIAGNOSIS — E119 Type 2 diabetes mellitus without complications: Secondary | ICD-10-CM | POA: Insufficient documentation

## 2021-10-05 DIAGNOSIS — R079 Chest pain, unspecified: Secondary | ICD-10-CM

## 2021-10-05 DIAGNOSIS — E782 Mixed hyperlipidemia: Secondary | ICD-10-CM | POA: Diagnosis not present

## 2021-10-05 HISTORY — DX: Essential (primary) hypertension: I10

## 2021-10-05 HISTORY — DX: Type 2 diabetes mellitus without complications: E11.9

## 2021-10-05 LAB — CBC
HCT: 42.2 % (ref 39.0–52.0)
Hemoglobin: 14.4 g/dL (ref 13.0–17.0)
MCH: 30.2 pg (ref 26.0–34.0)
MCHC: 34.1 g/dL (ref 30.0–36.0)
MCV: 88.5 fL (ref 80.0–100.0)
Platelets: 250 10*3/uL (ref 150–400)
RBC: 4.77 MIL/uL (ref 4.22–5.81)
RDW: 12.4 % (ref 11.5–15.5)
WBC: 7 10*3/uL (ref 4.0–10.5)
nRBC: 0 % (ref 0.0–0.2)

## 2021-10-05 LAB — BASIC METABOLIC PANEL
Anion gap: 9 (ref 5–15)
BUN: 15 mg/dL (ref 8–23)
CO2: 28 mmol/L (ref 22–32)
Calcium: 9.3 mg/dL (ref 8.9–10.3)
Chloride: 104 mmol/L (ref 98–111)
Creatinine, Ser: 0.99 mg/dL (ref 0.61–1.24)
GFR, Estimated: >60 mL/min (ref >60)
Glucose, Bld: 237 mg/dL — ABNORMAL HIGH (ref 70–99)
Potassium: 4.1 mmol/L (ref 3.5–5.1)
Sodium: 141 mmol/L (ref 135–145)

## 2021-10-05 LAB — TROPONIN I (HIGH SENSITIVITY)
Troponin I (High Sensitivity): 5 ng/L (ref <18)
Troponin I (High Sensitivity): 6 ng/L (ref <18)

## 2021-10-05 MED ORDER — ASPIRIN 81 MG PO CHEW
324.0000 mg | CHEWABLE_TABLET | Freq: Once | ORAL | Status: AC
  Administered 2021-10-05: 324 mg via ORAL
  Filled 2021-10-05: qty 4

## 2021-10-05 NOTE — Consult Note (Addendum)
?Cardiology Consultation:  ? ?Patient ID: Kenneth Gonzalez ?MRN: [id number]; DOB: 1941-07-11 ? ?Admit date: 10/05/2021 ?Date of Consult: 10/05/2021 ? ?PCP:  Kenneth Manes, MD ?  ?[facility] Providers ?Cardiologist:  None    ? ?Patient Profile:  ? ?Kenneth Gonzalez is a 81 y.o. male with a hx of HTN, DM, seizures who is being seen 10/05/2021 for the evaluation of chest pain at the request of Dr. Doren Custard. ? ?History of Present Illness:  ? ?Kenneth Gonzalez is na 81 year old male with above medical history who does not have any cardiac history. Has never been seen by a cardiologist or had any ischemic evaluations in the past.  ? ?Patient presented to his PCP on 3/22 complaining of left sided chest discomfort for the past 3 days. His PCP took and EKG that was reported to have t-wave inversions in the precordial leads. However, when reviewed only showed T-wave inversions in leads V1 and V2. EKG in the ED showed normal sinus rhythm, no ST or T wave abnormalities. Initial hsTn 5. CXR showed no acute findings.  ? ?On interview, patient reports that he has had intermittent chest pain for the past 2-3 days. 3 Days ago, patient was moving heavy cement statues. After moving the statues, he felt a sharp pain in the left side of his chest. Pain went away, but returned a day later. When the pain returned, it felt like a torn muscle and was worse when he lifted his arm above his head. Occasionally radiates to his back. Today, he does not have chest pain at rest. Pain is reproduced when he lifts his arm above his head. Denies any SOB. Denies any recent chest pain on exertion. Denies smoking cigarettes since he quit in 1985. Does not drink alcohol. Has diabetes that is well controlled. Also has HTN, HLD.  ? ? ?Past Medical History:  ?Diagnosis Date  ? Bladder problem   ? Cancer [facility])   ? skin  ? Diabetes mellitus without complication ([place])   ? Hypertension   ? Seizures ([place])   ? ? ?Past Surgical History:  ?Procedure Laterality Date  ?  CHOLECYSTECTOMY    ? NOSE SURGERY    ?  ? ?Home Medications:  ?Prior to Admission medications   ?Medication Sig Start Date End Date Taking? Authorizing Provider  ?Glucosamine-Chondroit-Vit C-Mn (GLUCOSAMINE CHONDR 1500 COMPLX PO) Take 1 tablet by mouth 2 (two) times daily.   Yes [provider]  ?lisinopril (ZESTRIL) 5 MG tablet Take 5 mg by mouth daily. 10/02/21  Yes [provider]  ?metFORMIN (GLUCOPHAGE-XR) 500 MG 24 hr tablet Take 500 mg by mouth daily. 12/01/13  Yes [provider]  ?montelukast (SINGULAIR) 10 MG tablet Take 1 tablet by mouth daily. 08/11/21  Yes [provider]  ?Multiple Vitamins-Minerals (PRESERVISION AREDS 2 PO) Take 1 capsule by mouth 2 (two) times daily.   Yes [provider]  ?OVER THE COUNTER MEDICATION Take 2 capsules by mouth 2 (two) times daily. Omega XL   Yes [provider]  ?OVER THE COUNTER MEDICATION Apply 1 application. topically daily as needed (pain). Hemp cream   Yes [provider]  ?phenytoin (DILANTIN) 100 MG ER capsule Take 200 mg by mouth at bedtime.   Yes [provider]  ?pravastatin (PRAVACHOL) 20 MG tablet Take 20 mg by mouth daily.   Yes [provider]  ? ? ?Inpatient Medications: ?Scheduled Meds: ? ?Continuous Infusions: ? ?PRN Meds: ? ? ?Allergies:   No Known  Allergies ? ?Social History:   ?Social History  ? ?Socioeconomic History  ? Marital status: Married  ?  Spouse name: Not on file  ? Number of children: Not on file  ? Years of education: Not on file  ? Highest education level: Not on file  ?Occupational History  ? Not on file  ?Tobacco Use  ? Smoking status: Former  ? Smokeless tobacco: Never  ?Vaping Use  ? Vaping Use: Never used  ?Substance and Sexual Activity  ? Alcohol use: No  ? Drug use: No  ? Sexual activity: Not on file  ?Other Topics Concern  ? Not on file  ?Social History Narrative  ? Not on file  ? ?Social Determinants of Health  ? ?Financial Resource Strain: Not on  file  ?Food Insecurity: Not on file  ?Transportation Needs: Not on file  ?Physical Activity: Not on file  ?Stress: Not on file  ?Social Connections: Not on file  ?Intimate Partner Violence: Not on file  ?  ?Family History:   ?No family history on file. Younger brother had a heart attack  ? ?ROS:  ?Please see the history of present illness.  ? ?All other ROS reviewed and negative.    ? ?Physical Exam/Data:  ? ?Vitals:  ? 10/05/21 1445 10/05/21 1500 10/05/21 1515 10/05/21 1530  ?BP: (!) 144/74 138/76 116/79 (!) 149/82  ?Pulse: 71 72 73 74  ?Resp: '14 14 12 10  '$ ?Temp:      ?TempSrc:      ?SpO2: 95% 93% 96% 98%  ? ?No intake or output data in the 24 hours ending 10/05/21 1556 ? ?  11/19/2017  ?  2:53 PM 05/14/2017  ? 12:59 PM 04/18/2013  ?  4:04 PM  ?Last 3 Weights  ?Weight (lbs) 205 lb 225 lb 215 lb  ?Weight (kg) 92.987 kg 102.059 kg 97.523 kg  ?   ?There is no height or weight on file to calculate BMI.  ?General:  Well nourished, well developed, in no acute distress ?HEENT: normal ?Neck: no JVD ?Vascular: Radial pulses 2+ bilaterally ?Cardiac:  normal S1, S2; RRR; no murmur. Tender to palpation over left chest  ?Lungs:  clear to auscultation bilaterally, no wheezing, rhonchi or rales  ?Abd: soft, nontender, no hepatomegaly  ?Ext: no edema ?Musculoskeletal:  No deformities, BUE and BLE strength normal and equal ?Skin: warm and dry  ?Neuro:  CNs 2-12 intact, no focal abnormalities noted ?Psych:  Normal affect  ? ?EKG:  The EKG was personally reviewed and demonstrates:  Sinus rhythm  ?Telemetry:  Telemetry was personally reviewed and demonstrates:  Sinus rhythm  ? ?Relevant CV Studies: ? ? ?Laboratory Data: ? ?High Sensitivity Troponin:   ?Recent Labs  ?Lab 10/05/21 ?1123 10/05/21 ?1419  ?TROPONINIHS 5 6  ?   ?Chemistry ?Recent Labs  ?Lab 10/05/21 ?1123  ?NA 141  ?K 4.1  ?CL 104  ?CO2 28  ?GLUCOSE 237*  ?BUN 15  ?CREATININE 0.99  ?CALCIUM 9.3  ?GFRNONAA >60  ?ANIONGAP 9  ?  ?No results for input(s): PROT, ALBUMIN, AST, ALT,  ALKPHOS, BILITOT in the last 168 hours. ?Lipids No results for input(s): CHOL, TRIG, HDL, LABVLDL, LDLCALC, CHOLHDL in the last 168 hours.  ?Hematology ?Recent Labs  ?Lab 10/05/21 ?1123  ?WBC 7.0  ?RBC 4.77  ?HGB 14.4  ?HCT 42.2  ?MCV 88.5  ?MCH 30.2  ?MCHC 34.1  ?RDW 12.4  ?PLT 250  ? ?Thyroid No results for input(s): TSH, FREET4 in the last 168 hours.  ?  BNPNo results for input(s): BNP, PROBNP in the last 168 hours.  ?DDimer No results for input(s): DDIMER in the last 168 hours. ? ? ?Radiology/Studies:  ?DG Chest 2 View ? ?Result Date: 10/05/2021 ?CLINICAL DATA:  EKG changes, intermittent left-sided chest pain. EXAM: CHEST - 2 VIEW COMPARISON:  09/17/2017. FINDINGS: Trachea is midline. Heart size normal. Minimal streaky atelectasis or scarring in the lung bases. No wrist with consolidation or pleural fluid. Old left rib fractures. IMPRESSION: No acute findings. Electronically Signed   By: Lorin Picket M.D.   On: 10/05/2021 11:50   ? ? ?Assessment and Plan:  ? ?Chest pain  ?- EKG in the ED normal, without ST or T wave changes  ?- Reviewed EKG from primary care: EKG is confounded by artifact, there are T wave inversions in V1 and V2 (suspect V2 t wave inversions due to lead placement)  ?- hsTn 5>>6  ?- Chest pain reproducible when patient lifts his arm. Tender to palpation  ?- Given that patient has not been having chest pain/sob on exertion recently, developed chest pain after lifting heavy statues, has chest pain that is reproduced on palpation/arm movement, and has a normal EKG and negative troponin, suspect that this is musculoskeletal.  ?- No need for further workup at this time  ?- Continue home medications  ? ? ?Risk Assessment/Risk Scores:  ? ?HEAR Score (for undifferentiated chest pain):  HEAR Score: 4{ ? ?  ?  ? ?   ? ?For questions or updates, please contact [place] ?Please consult [email or web address] for contact info under  ? ? ?Signed, ?Margie Billet, PA-C  ?10/05/2021 3:56 PM ? ? ?Attending  Note:  ? ?The patient was seen and examined.  Agree with assessment and plan as noted above.  Changes made to the above note as needed. ? ?Patient seen and independently examined with Vikki Ports, PA .

## 2021-10-05 NOTE — ED Provider Notes (Signed)
?[facility] ?Provider Note ? ? ?CSN: [id number] ?Arrival date & time: 10/05/21  1052 ? ?  ? ?History ? ?Chief Complaint  ?Patient presents with  ? Chest Pain  ? ? ?Kenneth Gonzalez is a 81 y.o. male. ? ? ?Chest Pain ?Patient presents for left-sided chest discomfort for the past 2 days.  His medical history includes HTN, DM, seizures.  He reports that he has had a very mild intermittent left-sided chest pain for several weeks.  Several days ago he was lifting some heavy concrete statues.  After this, he felt some worsened left-sided chest pain.  Chest pain has been intermittent.  He reports it is worse with lifting his left arm or taking a deep inspiration.  It does not tender to palpation.  He has been mild.  He went to his primary care doctor today and his primary care doctor obtained an EKG.  He was sent to the emergency department for further evaluation.  There was some concern of some new precordial lead T wave inversions that were not present on prior EKGs, however, patient has not had any prior EKGs in several years.  Patient has no history of heart problems other than hypertension.  He has not seen a cardiologist in the past.  He has not had any provocative cardiac testing in the past.  Currently, at rest, patient is pain-free.  He has not had any other associated symptoms. ?HPI: A 81 year old patient with a history of treated diabetes, hypertension, hypercholesterolemia and obesity presents for evaluation of chest pain. Initial onset of pain was less than one hour ago. The patient's chest pain is described as heaviness/pressure/tightness and is not worse with exertion. The patient's chest pain is middle- or left-sided, is not well-localized, is not sharp and does not radiate to the arms/jaw/neck. The patient does not complain of nausea and denies diaphoresis. The patient has no history of stroke, has no history of peripheral artery disease, has not smoked in the past 90  days and has no relevant family history of coronary artery disease (first degree relative at less than age 13).  ? ?Home Medications ?Prior to Admission medications   ?Medication Sig Start Date End Date Taking? Authorizing Provider  ?Glucosamine-Chondroit-Vit C-Mn (GLUCOSAMINE CHONDR 1500 COMPLX PO) Take 1 tablet by mouth 2 (two) times daily.   Yes [provider]  ?lisinopril (ZESTRIL) 5 MG tablet Take 5 mg by mouth daily. 10/02/21  Yes [provider]  ?metFORMIN (GLUCOPHAGE-XR) 500 MG 24 hr tablet Take 500 mg by mouth daily. 12/01/13  Yes [provider]  ?montelukast (SINGULAIR) 10 MG tablet Take 1 tablet by mouth daily. 08/11/21  Yes [provider]  ?Multiple Vitamins-Minerals (PRESERVISION AREDS 2 PO) Take 1 capsule by mouth 2 (two) times daily.   Yes [provider]  ?OVER THE COUNTER MEDICATION Take 2 capsules by mouth 2 (two) times daily. Omega XL   Yes [provider]  ?OVER THE COUNTER MEDICATION Apply 1 application. topically daily as needed (pain). Hemp cream   Yes [provider]  ?phenytoin (DILANTIN) 100 MG ER capsule Take 200 mg by mouth at bedtime.   Yes [provider]  ?pravastatin (PRAVACHOL) 20 MG tablet Take 20 mg by mouth daily.   Yes [provider]  ?   ? ?Allergies    ?Patient has no known allergies.   ? ?Review of Systems   ?Review of Systems  ?Cardiovascular:  Positive for chest pain.  ?All  other systems reviewed and are negative. ? ?Physical Exam ?Updated Vital Signs ?BP (!) 149/82   Pulse 74   Temp 98.4 ?F (36.9 ?C) (Oral)   Resp 10   SpO2 98%  ?Physical Exam ?Vitals and nursing note reviewed.  ?Constitutional:   ?   General: He is not in acute distress. ?   Appearance: He is well-developed and normal weight. He is not ill-appearing, toxic-appearing or diaphoretic.  ?HENT:  ?   Head: Normocephalic and atraumatic.  ?Eyes:  ?   Extraocular Movements: Extraocular movements intact.  ?   Conjunctiva/sclera:  Conjunctivae normal.  ?Cardiovascular:  ?   Rate and Rhythm: Normal rate and regular rhythm.  ?   Heart sounds: No murmur heard. ?Pulmonary:  ?   Effort: Pulmonary effort is normal. No respiratory distress.  ?   Breath sounds: Normal breath sounds.  ?Chest:  ?   Chest wall: No tenderness.  ?Abdominal:  ?   Palpations: Abdomen is soft.  ?   Tenderness: There is no abdominal tenderness.  ?Musculoskeletal:     ?   General: No swelling. Normal range of motion.  ?   Cervical back: Normal range of motion and neck supple.  ?   Right lower leg: No edema.  ?   Left lower leg: No edema.  ?Skin: ?   General: Skin is warm and dry.  ?   Capillary Refill: Capillary refill takes less than 2 seconds.  ?Neurological:  ?   General: No focal deficit present.  ?   Mental Status: He is alert and oriented to person, place, and time.  ?   Cranial Nerves: No cranial nerve deficit.  ?Psychiatric:     ?   Mood and Affect: Mood normal.     ?   Behavior: Behavior normal.  ? ? ?ED Results / Procedures / Treatments   ?Labs ?(all labs ordered are listed, but only abnormal results are displayed) ?Labs Reviewed  ?BASIC METABOLIC PANEL - Abnormal; Notable for the following components:  ?    Result Value  ? Glucose, Bld 237 (*)   ? All other components within normal limits  ?CBC  ?TROPONIN I (HIGH SENSITIVITY)  ?TROPONIN I (HIGH SENSITIVITY)  ? ? ?EKG ?EKG Interpretation ? ?Date/Time:  Wednesday October 05 2021 11:11:05 EDT ?Ventricular Rate:  76 ?PR Interval:  166 ?QRS Duration: 88 ?QT Interval:  394 ?QTC Calculation: 443 ?R Axis:   54 ?Text Interpretation: Normal sinus rhythm Normal ECG Confirmed by Godfrey Pick [phone]) on 10/05/2021 2:08:36 PM ? ?Radiology ?DG Chest 2 View ? ?Result Date: 10/05/2021 ?CLINICAL DATA:  EKG changes, intermittent left-sided chest pain. EXAM: CHEST - 2 VIEW COMPARISON:  09/17/2017. FINDINGS: Trachea is midline. Heart size normal. Minimal streaky atelectasis or scarring in the lung bases. No wrist with consolidation or pleural  fluid. Old left rib fractures. IMPRESSION: No acute findings. Electronically Signed   By: Lorin Picket M.D.   On: 10/05/2021 11:50   ? ?Procedures ?Procedures  ? ? ?Medications Ordered in ED ?Medications  ?aspirin chewable tablet 324 mg (has no administration in time range)  ? ? ?ED Course/ Medical Decision Making/ A&P ?  ?HEAR Score: 5                       ?Medical Decision Making ? ?This patient presents to the ED for concern of chest pain, this involves an extensive number of treatment options, and is a complaint that carries with it a  high risk of complications and morbidity.  The differential diagnosis includes ACS, muscular strain, costochondritis, GERD ? ? ?Co morbidities that complicate the patient evaluation ? ?HTN, DM, seizures ? ? ?Additional history obtained: ? ?Additional history obtained from patient's wife ?External records from outside source obtained and reviewed including EMR, paperwork from today's PCP visit ? ? ?Lab Tests: ? ?I Ordered, and personally interpreted labs.  The pertinent results include: Normal troponin, no leukocytosis, normal hemoglobin, normal electrolytes, hyperglycemia ? ? ?Imaging Studies ordered: ? ?I ordered imaging studies including chest x-ray ?I independently visualized and interpreted imaging which showed no acute findings ?I agree with the radiologist interpretation ? ? ?Cardiac Monitoring: / EKG: ? ?The patient was maintained on a cardiac monitor.  I personally viewed and interpreted the cardiac monitored which showed an underlying rhythm of: Sinus rhythm ? ? ?Consultations Obtained: ? ?I requested consultation with the cardiologist,  and discussed lab and imaging findings as well as pertinent plan - they recommend: Bedside evaluation by cardiology team ? ? ?Problem List / ED Course / Critical interventions / Medication management ? ?Patient is 81 year old male with no known history of cardiac disease, presenting for 2 days of intermittent left-sided chest pain.  He  was seen by his primary care doctor prior to arrival in the ED.  A EKG was obtained at his PCPs office.  There was concern at that time of some new precordial T wave inversions.  EKG was obtained on his

## 2021-10-05 NOTE — ED Triage Notes (Signed)
Patient to ED from PCP's office for evaluation of EKG changes and intermittent left sided chest pain that started two days ago. Patient is alert, oriented, ambulatory, speaking in complete sentences, and is in no apparent distress at this time. ?

## 2021-10-05 NOTE — ED Provider Triage Note (Signed)
Emergency Medicine Provider Triage Evaluation Note ? ?Kenneth Gonzalez , a 81 y.o. male  was evaluated in triage.  Pt complains of left-sided chest discomfort for 2 days.  No cardiac history.  Was sent down from his PCP upstairs for this complaint.  No shortness of breath, palpitations or dizziness.  Says that his pain was worse and when he was lifting heavy items ? ?Review of Systems  ?Positive: As above ?Negative: As above ? ?Physical Exam  ?BP (!) 153/88   Pulse 81   Temp 98.4 ?F (36.9 ?C) (Oral)   Resp 18   SpO2 96%  ?Gen:   Awake, no distress   ?Resp:  Normal effort  ?MSK:   Moves extremities without difficulty  ?Other:  RRR, lung sounds clear ? ?Medical Decision Making  ?Medically screening exam initiated at 11:17 AM.  Appropriate orders placed.  AKI ABALOS was informed that the remainder of the evaluation will be completed by another provider, this initial triage assessment does not replace that evaluation, and the importance of remaining in the ED until their evaluation is complete. ? ? ?  ?Rhae Hammock, PA-C ?10/05/21 1118 ? ?

## 2021-10-05 NOTE — ED Provider Notes (Signed)
Signout note ? ?81 year old gentleman presenting to ER with concern for chest pain.  EKG in ER without obvious ischemic change and initial troponin is normal.  Second troponin is in process.  Cardiology has been consulted and will evaluate patient. ? ?Plan at time of signout is to follow-up on repeat troponin and cardiology recommendations. ? ?3:22 PM received sign out from [place] ?  ?Lucrezia Starch, MD ?10/05/21 [phone] ? ?

## 2021-10-05 NOTE — Discharge Instructions (Signed)
Follow-up with your primary care doctor.  Come back to ER if you have worsening chest pain, difficulty breathing or other new concerning symptom. ?

## 2021-10-05 NOTE — ED Notes (Signed)
Cards MD at bedside

## 2021-11-01 DIAGNOSIS — Z961 Presence of intraocular lens: Secondary | ICD-10-CM | POA: Diagnosis not present

## 2021-11-01 DIAGNOSIS — H524 Presbyopia: Secondary | ICD-10-CM | POA: Diagnosis not present

## 2021-11-01 DIAGNOSIS — E119 Type 2 diabetes mellitus without complications: Secondary | ICD-10-CM | POA: Diagnosis not present

## 2021-11-03 DIAGNOSIS — G40909 Epilepsy, unspecified, not intractable, without status epilepticus: Secondary | ICD-10-CM | POA: Diagnosis not present

## 2021-11-03 DIAGNOSIS — Z79899 Other long term (current) drug therapy: Secondary | ICD-10-CM | POA: Diagnosis not present

## 2021-11-03 DIAGNOSIS — I10 Essential (primary) hypertension: Secondary | ICD-10-CM | POA: Diagnosis not present

## 2021-11-03 DIAGNOSIS — E1169 Type 2 diabetes mellitus with other specified complication: Secondary | ICD-10-CM | POA: Diagnosis not present

## 2021-11-03 DIAGNOSIS — I77811 Abdominal aortic ectasia: Secondary | ICD-10-CM | POA: Diagnosis not present

## 2021-11-03 DIAGNOSIS — E782 Mixed hyperlipidemia: Secondary | ICD-10-CM | POA: Diagnosis not present

## 2021-11-03 DIAGNOSIS — Z0000 Encounter for general adult medical examination without abnormal findings: Secondary | ICD-10-CM | POA: Diagnosis not present

## 2021-11-15 DIAGNOSIS — L814 Other melanin hyperpigmentation: Secondary | ICD-10-CM | POA: Diagnosis not present

## 2021-11-15 DIAGNOSIS — L821 Other seborrheic keratosis: Secondary | ICD-10-CM | POA: Diagnosis not present

## 2021-11-15 DIAGNOSIS — D225 Melanocytic nevi of trunk: Secondary | ICD-10-CM | POA: Diagnosis not present

## 2021-11-15 DIAGNOSIS — L570 Actinic keratosis: Secondary | ICD-10-CM | POA: Diagnosis not present

## 2021-11-22 DIAGNOSIS — J301 Allergic rhinitis due to pollen: Secondary | ICD-10-CM | POA: Diagnosis not present

## 2021-11-22 DIAGNOSIS — I10 Essential (primary) hypertension: Secondary | ICD-10-CM | POA: Diagnosis not present

## 2021-11-22 DIAGNOSIS — E1169 Type 2 diabetes mellitus with other specified complication: Secondary | ICD-10-CM | POA: Diagnosis not present

## 2021-12-14 DIAGNOSIS — J0110 Acute frontal sinusitis, unspecified: Secondary | ICD-10-CM | POA: Diagnosis not present

## 2022-01-12 DIAGNOSIS — R350 Frequency of micturition: Secondary | ICD-10-CM | POA: Diagnosis not present

## 2022-01-12 DIAGNOSIS — Z87438 Personal history of other diseases of male genital organs: Secondary | ICD-10-CM | POA: Diagnosis not present

## 2022-01-12 DIAGNOSIS — R197 Diarrhea, unspecified: Secondary | ICD-10-CM | POA: Diagnosis not present

## 2022-01-12 DIAGNOSIS — E1142 Type 2 diabetes mellitus with diabetic polyneuropathy: Secondary | ICD-10-CM | POA: Diagnosis not present

## 2022-03-27 DIAGNOSIS — R0981 Nasal congestion: Secondary | ICD-10-CM | POA: Diagnosis not present

## 2022-03-27 DIAGNOSIS — Z23 Encounter for immunization: Secondary | ICD-10-CM | POA: Diagnosis not present

## 2022-03-28 ENCOUNTER — Other Ambulatory Visit: Payer: Self-pay | Admitting: Geriatric Medicine

## 2022-03-28 ENCOUNTER — Ambulatory Visit
Admission: RE | Admit: 2022-03-28 | Discharge: 2022-03-28 | Disposition: A | Discharge status: Home / Self Care | Payer: Medicare PPO | Source: Ambulatory Visit | Attending: Geriatric Medicine | Admitting: Geriatric Medicine

## 2022-03-28 DIAGNOSIS — R0981 Nasal congestion: Secondary | ICD-10-CM

## 2022-03-28 DIAGNOSIS — R519 Headache, unspecified: Secondary | ICD-10-CM | POA: Diagnosis not present

## 2022-04-18 DIAGNOSIS — J310 Chronic rhinitis: Secondary | ICD-10-CM | POA: Diagnosis not present

## 2022-04-18 DIAGNOSIS — J342 Deviated nasal septum: Secondary | ICD-10-CM | POA: Diagnosis not present

## 2022-04-18 DIAGNOSIS — J343 Hypertrophy of nasal turbinates: Secondary | ICD-10-CM | POA: Diagnosis not present

## 2022-05-04 DIAGNOSIS — G40909 Epilepsy, unspecified, not intractable, without status epilepticus: Secondary | ICD-10-CM | POA: Diagnosis not present

## 2022-05-04 DIAGNOSIS — E1169 Type 2 diabetes mellitus with other specified complication: Secondary | ICD-10-CM | POA: Diagnosis not present

## 2022-05-04 DIAGNOSIS — I10 Essential (primary) hypertension: Secondary | ICD-10-CM | POA: Diagnosis not present

## 2022-05-22 DIAGNOSIS — Z85828 Personal history of other malignant neoplasm of skin: Secondary | ICD-10-CM | POA: Diagnosis not present

## 2022-05-22 DIAGNOSIS — L814 Other melanin hyperpigmentation: Secondary | ICD-10-CM | POA: Diagnosis not present

## 2022-05-22 DIAGNOSIS — D225 Melanocytic nevi of trunk: Secondary | ICD-10-CM | POA: Diagnosis not present

## 2022-05-22 DIAGNOSIS — L570 Actinic keratosis: Secondary | ICD-10-CM | POA: Diagnosis not present

## 2022-05-22 DIAGNOSIS — Z08 Encounter for follow-up examination after completed treatment for malignant neoplasm: Secondary | ICD-10-CM | POA: Diagnosis not present

## 2022-05-22 DIAGNOSIS — L821 Other seborrheic keratosis: Secondary | ICD-10-CM | POA: Diagnosis not present

## 2022-05-22 DIAGNOSIS — Z872 Personal history of diseases of the skin and subcutaneous tissue: Secondary | ICD-10-CM | POA: Diagnosis not present

## 2022-06-01 ENCOUNTER — Encounter (HOSPITAL_BASED_OUTPATIENT_CLINIC_OR_DEPARTMENT_OTHER)
Admission: RE | Admit: 2022-06-01 | Discharge: 2022-06-01 | Disposition: A | Discharge status: Home / Self Care | Payer: Medicare PPO | Source: Ambulatory Visit | Attending: Otolaryngology | Admitting: Otolaryngology

## 2022-06-01 ENCOUNTER — Encounter (HOSPITAL_BASED_OUTPATIENT_CLINIC_OR_DEPARTMENT_OTHER): Payer: Self-pay | Admitting: Otolaryngology

## 2022-06-01 ENCOUNTER — Other Ambulatory Visit: Payer: Self-pay | Admitting: Otolaryngology

## 2022-06-01 ENCOUNTER — Other Ambulatory Visit: Payer: Self-pay

## 2022-06-01 DIAGNOSIS — Z01812 Encounter for preprocedural laboratory examination: Secondary | ICD-10-CM | POA: Diagnosis not present

## 2022-06-01 LAB — BASIC METABOLIC PANEL
Anion gap: 10 (ref 5–15)
BUN: 16 mg/dL (ref 8–23)
CO2: 25 mmol/L (ref 22–32)
Calcium: 9 mg/dL (ref 8.9–10.3)
Chloride: 106 mmol/L (ref 98–111)
Creatinine, Ser: 1.12 mg/dL (ref 0.61–1.24)
GFR, Estimated: >60 mL/min (ref >60)
Glucose, Bld: 107 mg/dL — ABNORMAL HIGH (ref 70–99)
Potassium: 4.8 mmol/L (ref 3.5–5.1)
Sodium: 141 mmol/L (ref 135–145)

## 2022-06-12 ENCOUNTER — Encounter (HOSPITAL_BASED_OUTPATIENT_CLINIC_OR_DEPARTMENT_OTHER)
Admission: RE | Disposition: A | Discharge status: Home / Self Care | Payer: Self-pay | Source: Home / Self Care | Attending: Otolaryngology

## 2022-06-12 ENCOUNTER — Other Ambulatory Visit: Payer: Self-pay

## 2022-06-12 ENCOUNTER — Ambulatory Visit (HOSPITAL_BASED_OUTPATIENT_CLINIC_OR_DEPARTMENT_OTHER): Payer: Medicare PPO | Admitting: Anesthesiology

## 2022-06-12 ENCOUNTER — Ambulatory Visit (HOSPITAL_BASED_OUTPATIENT_CLINIC_OR_DEPARTMENT_OTHER)
Admission: RE | Admit: 2022-06-12 | Discharge: 2022-06-12 | Disposition: A | Discharge status: Home / Self Care | Payer: Medicare PPO | Attending: Otolaryngology | Admitting: Otolaryngology

## 2022-06-12 ENCOUNTER — Encounter (HOSPITAL_BASED_OUTPATIENT_CLINIC_OR_DEPARTMENT_OTHER): Payer: Self-pay | Admitting: Otolaryngology

## 2022-06-12 DIAGNOSIS — E119 Type 2 diabetes mellitus without complications: Secondary | ICD-10-CM

## 2022-06-12 DIAGNOSIS — J342 Deviated nasal septum: Secondary | ICD-10-CM

## 2022-06-12 DIAGNOSIS — J3489 Other specified disorders of nose and nasal sinuses: Secondary | ICD-10-CM | POA: Diagnosis not present

## 2022-06-12 DIAGNOSIS — I10 Essential (primary) hypertension: Secondary | ICD-10-CM | POA: Insufficient documentation

## 2022-06-12 DIAGNOSIS — Z87891 Personal history of nicotine dependence: Secondary | ICD-10-CM | POA: Diagnosis not present

## 2022-06-12 DIAGNOSIS — J343 Hypertrophy of nasal turbinates: Secondary | ICD-10-CM | POA: Insufficient documentation

## 2022-06-12 DIAGNOSIS — J310 Chronic rhinitis: Secondary | ICD-10-CM | POA: Insufficient documentation

## 2022-06-12 HISTORY — PX: NASAL SEPTOPLASTY W/ TURBINOPLASTY: SHX2070

## 2022-06-12 LAB — GLUCOSE, CAPILLARY
Glucose-Capillary: 164 mg/dL — ABNORMAL HIGH (ref 70–99)
Glucose-Capillary: 202 mg/dL — ABNORMAL HIGH (ref 70–99)

## 2022-06-12 SURGERY — SEPTOPLASTY, NOSE, WITH NASAL TURBINATE REDUCTION
Anesthesia: General | Site: Nose | Laterality: Bilateral

## 2022-06-12 MED ORDER — OXYMETAZOLINE HCL 0.05 % NA SOLN
NASAL | Status: DC | PRN
  Administered 2022-06-12: 1 via TOPICAL

## 2022-06-12 MED ORDER — CEFAZOLIN SODIUM-DEXTROSE 2-3 GM-%(50ML) IV SOLR
INTRAVENOUS | Status: DC | PRN
  Administered 2022-06-12: 2 g via INTRAVENOUS

## 2022-06-12 MED ORDER — ONDANSETRON HCL 4 MG/2ML IJ SOLN
INTRAMUSCULAR | Status: DC | PRN
  Administered 2022-06-12: 4 mg via INTRAVENOUS

## 2022-06-12 MED ORDER — LACTATED RINGERS IV SOLN: INTRAVENOUS | Status: DC

## 2022-06-12 MED ORDER — ACETAMINOPHEN 500 MG PO TABS
1000.0000 mg | ORAL_TABLET | Freq: Once | ORAL | Status: AC
  Administered 2022-06-12: 1000 mg via ORAL

## 2022-06-12 MED ORDER — LIDOCAINE HCL (CARDIAC) PF 100 MG/5ML IV SOSY
PREFILLED_SYRINGE | INTRAVENOUS | Status: DC | PRN
  Administered 2022-06-12: 60 mg via INTRAVENOUS

## 2022-06-12 MED ORDER — FENTANYL CITRATE (PF) 100 MCG/2ML IJ SOLN
25.0000 ug | INTRAMUSCULAR | Status: DC | PRN
  Administered 2022-06-12: 25 ug via INTRAVENOUS

## 2022-06-12 MED ORDER — FENTANYL CITRATE (PF) 100 MCG/2ML IJ SOLN
INTRAMUSCULAR | Status: AC
  Filled 2022-06-12: qty 2

## 2022-06-12 MED ORDER — ONDANSETRON HCL 4 MG/2ML IJ SOLN
INTRAMUSCULAR | Status: AC
  Filled 2022-06-12: qty 2

## 2022-06-12 MED ORDER — MIDAZOLAM HCL 5 MG/5ML IJ SOLN
INTRAMUSCULAR | Status: DC | PRN
  Administered 2022-06-12: 1 mg via INTRAVENOUS

## 2022-06-12 MED ORDER — ROCURONIUM BROMIDE 100 MG/10ML IV SOLN
INTRAVENOUS | Status: DC | PRN
  Administered 2022-06-12: 50 mg via INTRAVENOUS

## 2022-06-12 MED ORDER — AMOXICILLIN 875 MG PO TABS: 875.0000 mg | ORAL_TABLET | Freq: Two times a day (BID) | ORAL | 0 refills | Status: AC

## 2022-06-12 MED ORDER — DEXAMETHASONE SODIUM PHOSPHATE 4 MG/ML IJ SOLN
INTRAMUSCULAR | Status: DC | PRN
  Administered 2022-06-12: 8 mg via INTRAVENOUS

## 2022-06-12 MED ORDER — ROCURONIUM BROMIDE 10 MG/ML (PF) SYRINGE
PREFILLED_SYRINGE | INTRAVENOUS | Status: AC
  Filled 2022-06-12: qty 10

## 2022-06-12 MED ORDER — DEXAMETHASONE SODIUM PHOSPHATE 10 MG/ML IJ SOLN
INTRAMUSCULAR | Status: AC
  Filled 2022-06-12: qty 1

## 2022-06-12 MED ORDER — LIDOCAINE-EPINEPHRINE 1 %-1:100000 IJ SOLN
INTRAMUSCULAR | Status: DC | PRN
  Administered 2022-06-12: 5 mL

## 2022-06-12 MED ORDER — LIDOCAINE 2% (20 MG/ML) 5 ML SYRINGE
INTRAMUSCULAR | Status: AC
  Filled 2022-06-12: qty 5

## 2022-06-12 MED ORDER — ACETAMINOPHEN 500 MG PO TABS
ORAL_TABLET | ORAL | Status: AC
  Filled 2022-06-12: qty 2

## 2022-06-12 MED ORDER — OXYCODONE-ACETAMINOPHEN 5-325 MG PO TABS: 1.0000 | ORAL_TABLET | ORAL | 0 refills | Status: AC | PRN

## 2022-06-12 MED ORDER — FENTANYL CITRATE (PF) 100 MCG/2ML IJ SOLN
INTRAMUSCULAR | Status: DC | PRN
  Administered 2022-06-12 (×2): 50 ug via INTRAVENOUS

## 2022-06-12 MED ORDER — MIDAZOLAM HCL 2 MG/2ML IJ SOLN
INTRAMUSCULAR | Status: AC
  Filled 2022-06-12: qty 2

## 2022-06-12 MED ORDER — PROPOFOL 10 MG/ML IV BOLUS
INTRAVENOUS | Status: DC | PRN
  Administered 2022-06-12: 80 mg via INTRAVENOUS

## 2022-06-12 MED ORDER — MUPIROCIN 2 % EX OINT
TOPICAL_OINTMENT | CUTANEOUS | Status: DC | PRN
  Administered 2022-06-12: 1 via NASAL

## 2022-06-12 SURGICAL SUPPLY — 30 items
ATTRACTOMAT 16X20 MAGNETIC DRP (DRAPES) IMPLANT
CANISTER SUCT 1200ML W/VALVE (MISCELLANEOUS) ×1 IMPLANT
COAGULATOR SUCT 8FR VV (MISCELLANEOUS) ×1 IMPLANT
DEFOGGER MIRROR 1QT (MISCELLANEOUS) ×1 IMPLANT
DRSG NASOPORE 8CM (GAUZE/BANDAGES/DRESSINGS) IMPLANT
DRSG TELFA 3X8 NADH STRL (GAUZE/BANDAGES/DRESSINGS) IMPLANT
ELECT REM PT RETURN 9FT ADLT (ELECTROSURGICAL) ×1
ELECTRODE REM PT RTRN 9FT ADLT (ELECTROSURGICAL) ×1 IMPLANT
GLOVE BIO SURGEON STRL SZ7.5 (GLOVE) ×1 IMPLANT
GOWN STRL REUS W/ TWL LRG LVL3 (GOWN DISPOSABLE) ×2 IMPLANT
GOWN STRL REUS W/TWL LRG LVL3 (GOWN DISPOSABLE) ×2
NDL HYPO 25X1 1.5 SAFETY (NEEDLE) ×1 IMPLANT
NEEDLE HYPO 25X1 1.5 SAFETY (NEEDLE) ×1 IMPLANT
NS IRRIG 1000ML POUR BTL (IV SOLUTION) ×1 IMPLANT
PACK BASIN DAY SURGERY FS (CUSTOM PROCEDURE TRAY) ×1 IMPLANT
PACK ENT DAY SURGERY (CUSTOM PROCEDURE TRAY) ×1 IMPLANT
SLEEVE SCD COMPRESS KNEE MED (STOCKING) IMPLANT
SPIKE FLUID TRANSFER (MISCELLANEOUS) IMPLANT
SPLINT NASAL AIRWAY SILICONE (MISCELLANEOUS) ×1 IMPLANT
SPONGE GAUZE 2X2 8PLY STRL LF (GAUZE/BANDAGES/DRESSINGS) ×1 IMPLANT
SPONGE NEURO XRAY DETECT 1X3 (DISPOSABLE) ×1 IMPLANT
SUT CHROMIC 4 0 P 3 18 (SUTURE) ×1 IMPLANT
SUT PLAIN 4 0 ~~LOC~~ 1 (SUTURE) ×1 IMPLANT
SUT PROLENE 3 0 PS 2 (SUTURE) ×1 IMPLANT
SUT VIC AB 4-0 P-3 18XBRD (SUTURE) IMPLANT
SUT VIC AB 4-0 P3 18 (SUTURE)
TOWEL GREEN STERILE FF (TOWEL DISPOSABLE) ×1 IMPLANT
TUBE SALEM SUMP 12R W/ARV (TUBING) IMPLANT
TUBE SALEM SUMP 16 FR W/ARV (TUBING) ×1 IMPLANT
YANKAUER SUCT BULB TIP NO VENT (SUCTIONS) ×1 IMPLANT

## 2022-06-12 NOTE — Anesthesia Procedure Notes (Signed)
Procedure Name: Intubation Date/Time: 06/12/2022 9:04 AM  Performed by: Tawni Millers, CRNAPre-anesthesia Checklist: Patient identified, Emergency Drugs available, Suction available and Patient being monitored Patient Re-evaluated:Patient Re-evaluated prior to induction Oxygen Delivery Method: Circle system utilized Preoxygenation: Pre-oxygenation with 100% oxygen Induction Type: IV induction Ventilation: Mask ventilation without difficulty Laryngoscope Size: Mac and 3 Grade View: Grade I Tube type: Oral Tube size: 7.0 mm Number of attempts: 1 Airway Equipment and Method: Stylet and Oral airway Placement Confirmation: ETT inserted through vocal cords under direct vision, positive ETCO2 and breath sounds checked- equal and bilateral Tube secured with: Tape Dental Injury: Teeth and Oropharynx as per pre-operative assessment

## 2022-06-12 NOTE — Anesthesia Preprocedure Evaluation (Addendum)
Anesthesia Evaluation  Patient identified by MRN, date of birth, ID band Patient awake    Reviewed: Allergy & Precautions, H&P , NPO status , Patient's Chart, lab work & pertinent test results  Airway Mallampati: II  TM Distance: >3 FB Neck ROM: Full    Dental no notable dental hx. (+) Edentulous Upper, Edentulous Lower, Dental Advisory Given   Pulmonary former smoker   Pulmonary exam normal breath sounds clear to auscultation       Cardiovascular hypertension, Pt. on medications  Rhythm:Regular Rate:Normal     Neuro/Psych negative neurological ROS  negative psych ROS   GI/Hepatic negative GI ROS, Neg liver ROS,,,  Endo/Other  diabetes, Type 2, Oral Hypoglycemic Agents    Renal/GU negative Renal ROS  negative genitourinary   Musculoskeletal   Abdominal   Peds  Hematology negative hematology ROS (+)   Anesthesia Other Findings   Reproductive/Obstetrics negative OB ROS                             Anesthesia Physical Anesthesia Plan  ASA: 2  Anesthesia Plan: General   Post-op Pain Management: Tylenol PO (pre-op)*   Induction: Intravenous  PONV Risk Score and Plan: 3 and Ondansetron and Dexamethasone  Airway Management Planned: Oral ETT  Additional Equipment:   Intra-op Plan:   Post-operative Plan: Extubation in OR  Informed Consent: I have reviewed the patients History and Physical, chart, labs and discussed the procedure including the risks, benefits and alternatives for the proposed anesthesia with the patient or authorized representative who has indicated his/her understanding and acceptance.     Dental advisory given  Plan Discussed with: CRNA  Anesthesia Plan Comments:        Anesthesia Quick Evaluation

## 2022-06-12 NOTE — Transfer of Care (Signed)
Immediate Anesthesia Transfer of Care Note  Patient: Kenneth Gonzalez  Procedure(s) Performed: BILATERAL NASAL SEPTOPLASTY WITH TURBINATE REDUCTION (Bilateral)  Patient Location: PACU  Anesthesia Type:General  Level of Consciousness: awake  Airway & Oxygen Therapy: Patient Spontanous Breathing and Patient connected to face mask oxygen  Post-op Assessment: Report given to RN and Post -op Vital signs reviewed and stable  Post vital signs: Reviewed and stable  Last Vitals:  Vitals Value Taken Time  BP    Temp 36.4 C 06/12/22 0950  Pulse 82 06/12/22 0950  Resp 14 06/12/22 0950  SpO2 92 % 06/12/22 0950  Vitals shown include unvalidated device data.  Last Pain:  Vitals:   06/12/22 0813  TempSrc: Oral  PainSc: 0-No pain         Complications: No notable events documented.

## 2022-06-12 NOTE — Anesthesia Postprocedure Evaluation (Signed)
Anesthesia Post Note  Patient: Kenneth Gonzalez  Procedure(s) Performed: BILATERAL NASAL SEPTOPLASTY WITH TURBINATE REDUCTION (Bilateral)     Patient location during evaluation: PACU Anesthesia Type: General Level of consciousness: awake and alert Pain management: pain level controlled Vital Signs Assessment: post-procedure vital signs reviewed and stable Respiratory status: spontaneous breathing, nonlabored ventilation and respiratory function stable Cardiovascular status: blood pressure returned to baseline and stable Postop Assessment: no apparent nausea or vomiting Anesthetic complications: no  No notable events documented.  Last Vitals:  Vitals:   06/12/22 1030 06/12/22 1045  BP: 135/64 (!) 143/70  Pulse: 72 75  Resp: 19 16  Temp:    SpO2: 93% 92%    Last Pain:  Vitals:   06/12/22 1045  TempSrc:   PainSc: Asleep                 Chey Rachels,W. EDMOND

## 2022-06-12 NOTE — Discharge Instructions (Addendum)
POSTOPERATIVE INSTRUCTIONS FOR PATIENTS HAVING NASAL OR SINUS OPERATIONS ACTIVITY: Restrict activity at home for the first two days, resting as much as possible. Light activity is best. You may usually return to work within a week. You should refrain from nose blowing, strenuous activity, or heavy lifting greater than 20lbs for a total of one week after your operation.  If sneezing cannot be avoided, sneeze with your mouth open. DISCOMFORT: You may experience a dull headache and pressure along with nasal congestion and discharge. These symptoms may be worse during the first week after the operation but may last as long as two to four weeks.  Please take Tylenol or the pain medication that has been prescribed for you. Do not take aspirin or aspirin containing medications since they may cause bleeding.  You may experience symptoms of post nasal drainage, nasal congestion, headaches and fatigue for two or three months after your operation.  BLEEDING: You may have some blood tinged nasal drainage for approximately two weeks after the operation.  The discharge will be worse for the first week.  Please call our office at [phone] or go to the nearest hospital emergency room if you experience any of the following: heavy, bright red blood from your nose or mouth that lasts longer than 15 minutes or coughing up or vomiting bright red blood or blood clots. GENERAL CONSIDERATIONS: A gauze dressing will be placed on your upper lip to absorb any drainage after the operation. You may need to change this several times a day.  If you do not have very much drainage, you may remove the dressing.  Remember that you may gently wipe your nose with a tissue and sniff in, but DO NOT blow your nose. Please keep all of your postoperative appointments.  Your final results after the operation will depend on proper follow-up.  The initial visit is usually 2 to 5 days after the operation.  During this visit, the remaining nasal  packing and internal septal splints will be removed.  Your nasal and sinus cavities will be cleaned.  During the second visit, your nasal and sinus cavities will be cleaned again. Have someone drive you to your first two postoperative appointments.  How you care for your nose after the operation will influence the results that you obtain.  You should follow all directions, take your medication as prescribed, and call our office [phone] with any problems or questions. You may be more comfortable sleeping with your head elevated on two pillows. Do not take any medications that we have not prescribed or recommended. WARNING SIGNS: if any of the following should occur, please call our office: Persistent fever greater than 102F. Persistent vomiting. Severe and constant pain that is not relieved by prescribed pain medication. Trauma to the nose. Rash or unusual side effects from any medicines.    Post Anesthesia Home Care Instructions  Activity: Get plenty of rest for the remainder of the day. A responsible individual must stay with you for 24 hours following the procedure.  For the next 24 hours, DO NOT: -Drive a car -Paediatric nurse -Drink alcoholic beverages -Take any medication unless instructed by your physician -Make any legal decisions or sign important papers.  Meals: Start with liquid foods such as gelatin or soup. Progress to regular foods as tolerated. Avoid greasy, spicy, heavy foods. If nausea and/or vomiting occur, drink only clear liquids until the nausea and/or vomiting subsides. Call your physician if vomiting continues.  Special Instructions/Symptoms: Your throat may feel dry  or sore from the anesthesia or the breathing tube placed in your throat during surgery. If this causes discomfort, gargle with warm salt water. The discomfort should disappear within 24 hours.  Can take Tylenol after 2:15pm

## 2022-06-12 NOTE — H&P (Signed)
Cc: Chronic nasal obstruction  HPI: The patient is an 81 year old male who presents today complaining of chronic nasal obstruction for the past 5+ months.  The patient was treated with Flonase and Singulair without significant improvement in his symptoms.  Due to his severe nasal obstruction, he is now a habitual mouth breather.  The patient previously underwent nasal surgery in the 1960s to treat his nasal obstruction.  He was doing well after his surgery.  He had no nasal difficulty until earlier this year.  Currently he denies any facial pain, fever, or visual change.  The patient's review of systems (constitutional, eyes, ENT, cardiovascular, respiratory, GI, musculoskeletal, skin, neurologic, psychiatric, endocrine, hematologic, allergic) is noted in the ROS questionnaire.  It is reviewed with the patient.  Major events: Knee and nasal surgery, skin cancer, history of seizures.  Ongoing medical problems: Hypertension, arthritis, skin cancer, headaches hay fever.  Family health history: Diabetes.  Social history: The patient is married. He is a former smoker. He denies the use of alcohol or illegal drugs.    Exam: General: Communicates without difficulty, well nourished, no acute distress. Head: Normocephalic, no evidence injury, no tenderness, facial buttresses intact without stepoff. Face/sinus: No tenderness to palpation and percussion. Facial movement is normal and symmetric. Eyes: PERRL, EOMI. No scleral icterus, conjunctivae clear. Neuro: CN II exam reveals vision grossly intact.  No nystagmus at any point of gaze. Ears: Auricles well formed without lesions.  Ear canals are intact without mass or lesion.  No erythema or edema is appreciated.  The TMs are intact without fluid. Nose: External evaluation reveals normal support and skin without lesions.  Dorsum is intact.  Anterior rhinoscopy reveals severely congested mucosa over anterior aspect of inferior turbinates and intact septum.  No  purulence noted. Oral:  Oral cavity and oropharynx are intact, symmetric, without erythema or edema.  Mucosa is moist without lesions. Neck: Full range of motion without pain.  There is no significant lymphadenopathy.  No masses palpable.  Thyroid bed within normal limits to palpation.  Parotid glands and submandibular glands equal bilaterally without mass.  Trachea is midline. Neuro:  CN 2-12 grossly intact. Gait wide-based. A flexible scope was inserted into the right nasal cavity.  Endoscopy of the interior nasal cavity, superior, inferior, and middle meatus was performed. The sphenoid-ethmoid recess was examined. Edematous mucosa was noted.  No polyp, mass, or lesion was appreciated.  Severe nasal septal deviation noted. Olfactory cleft was clear.  Nasopharynx was clear.  Turbinates were hypertrophied but without mass.  The procedure was repeated on the contralateral side with similar findings.  The patient tolerated the procedure well.   Assessment  1.  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  Nearly 99% of his nasal passageways are obstructed bilaterally. 2.  The patient has not responded to medical treatment with Flonase and Singulair. 3.  The patient previously underwent nasal surgery in the 1960s.  Plan  1.  The physical exam and nasal endoscopy findings are reviewed with the patient. 2.  Continue with Flonase nasal spray daily. 3.  In light of his severe nasal obstruction and nonresponsiveness to medical therapy, he may benefit from surgical intervention with revision septoplasty and turbinate reduction.  The risk, benefits, and details of the procedures are discussed with the patient.  Questions are invited and answered. 4.  The patient would like to proceed with the procedures.

## 2022-06-12 NOTE — Op Note (Signed)
DATE OF PROCEDURE: 06/12/2022  OPERATIVE REPORT   SURGEON: Leta Baptist, MD   PREOPERATIVE DIAGNOSES:  1. Severe nasal septal deviation.  2. Bilateral inferior turbinate hypertrophy.  3. Chronic nasal obstruction.  POSTOPERATIVE DIAGNOSES:  1. Severe nasal septal deviation.  2. Bilateral inferior turbinate hypertrophy.  3. Chronic nasal obstruction.  PROCEDURE PERFORMED:  1. Septoplasty.  2. Bilateral partial inferior turbinate resection.   ANESTHESIA: General endotracheal tube anesthesia.   COMPLICATIONS: None.   ESTIMATED BLOOD LOSS: 150 mL.   INDICATION FOR PROCEDURE: Kenneth Gonzalez is a 81 y.o. male with a history of chronic nasal obstruction. The patient was treated with antihistamine, decongestant, and steroid nasal sprays. However, the patient continued to be symptomatic. On examination, the patient was noted to have bilateral severe inferior turbinate hypertrophy and significant nasal septal deviation, causing significant nasal obstruction. Based on the above findings, the decision was made for the patient to undergo the above-stated procedures. The risks, benefits, alternatives, and details of the procedures were discussed with the patient. Questions were invited and answered. Informed consent was obtained.   DESCRIPTION OF PROCEDURE: The patient was taken to the operating room and placed supine on the operating table. General endotracheal tube anesthesia was administered by the anesthesiologist. The patient was positioned, and prepped and draped in the standard fashion for nasal surgery. Pledgets soaked with Afrin were placed in both nasal cavities for decongestion. The pledgets were subsequently removed.  Examination of the nasal cavity revealed a severe nasal septal deviation. 1% lidocaine with 1:100,000 epinephrine was injected onto the nasal septum bilaterally. A hemitransfixion incision was made on the left side. The mucosal flap was carefully elevated on the left side. A  cartilaginous incision was made 1 cm superior to the caudal margin of the nasal septum. Mucosal flap was also elevated on the right side in the similar fashion. It should be noted that due to the severe septal deviation, the deviated portion of the cartilaginous and bony septum had to be removed in piecemeal fashion. Once the deviated portions were removed, a straight midline septum was achieved. The septum was then quilted with 4-0 plain gut sutures. The hemitransfixion incision was closed with interrupted 4-0 chromic sutures.   The inferior one half of both hypertrophied inferior turbinate was crossclamped with a Kelly clamp. The inferior one half of each inferior turbinate was then resected with a pair of cross cutting scissors. Hemostasis was achieved with a suction cautery device.  Doyle splints were applied to the nasal septum.  The care of the patient was turned over to the anesthesiologist. The patient was awakened from anesthesia without difficulty. The patient was extubated and transferred to the recovery room in good condition.   OPERATIVE FINDINGS: Severe nasal septal deviation and bilateral inferior turbinate hypertrophy.   SPECIMEN: None.   FOLLOWUP CARE: The patient be discharged home once he is awake and alert. The patient will follow up in my office in 3 days for splint removal.   Kaylana Fenstermacher Raynelle Bring, MD

## 2022-06-13 NOTE — Progress Notes (Signed)
Pt sts that he can't breathe through his nose and this RN told him that would be normal at this point and he needed to breathe through his mouth.  Pt also advised to not blow his nose and pt stated "he wasn't going to blow his nose he was going to blow something else."  This RN asked pt what he meant by that and he handed the phone to his wife.  The wife stated he was referring to a gun and this RN stated that if she needed to call 911 please do so.  Wife stated that the pt was all mouth.  This RN asked the wife if she was safe, and she stated she was safe.  Wife advised that if she had any concerns to please call 911 or Dr. Deeann Saint office.

## 2022-09-26 DIAGNOSIS — I10 Essential (primary) hypertension: Secondary | ICD-10-CM | POA: Diagnosis not present

## 2022-09-26 DIAGNOSIS — E1169 Type 2 diabetes mellitus with other specified complication: Secondary | ICD-10-CM | POA: Diagnosis not present

## 2022-09-26 DIAGNOSIS — G250 Essential tremor: Secondary | ICD-10-CM | POA: Diagnosis not present

## 2022-09-26 DIAGNOSIS — R197 Diarrhea, unspecified: Secondary | ICD-10-CM | POA: Diagnosis not present

## 2022-10-31 DIAGNOSIS — E119 Type 2 diabetes mellitus without complications: Secondary | ICD-10-CM | POA: Diagnosis not present

## 2022-10-31 DIAGNOSIS — I10 Essential (primary) hypertension: Secondary | ICD-10-CM | POA: Diagnosis not present

## 2022-10-31 DIAGNOSIS — G250 Essential tremor: Secondary | ICD-10-CM | POA: Diagnosis not present

## 2022-11-02 DIAGNOSIS — R69 Illness, unspecified: Secondary | ICD-10-CM | POA: Diagnosis not present

## 2022-11-13 DIAGNOSIS — H524 Presbyopia: Secondary | ICD-10-CM | POA: Diagnosis not present

## 2022-11-13 DIAGNOSIS — E119 Type 2 diabetes mellitus without complications: Secondary | ICD-10-CM | POA: Diagnosis not present

## 2022-11-14 DIAGNOSIS — I10 Essential (primary) hypertension: Secondary | ICD-10-CM | POA: Diagnosis not present

## 2022-11-29 DIAGNOSIS — Z08 Encounter for follow-up examination after completed treatment for malignant neoplasm: Secondary | ICD-10-CM | POA: Diagnosis not present

## 2022-11-29 DIAGNOSIS — L728 Other follicular cysts of the skin and subcutaneous tissue: Secondary | ICD-10-CM | POA: Diagnosis not present

## 2022-11-29 DIAGNOSIS — D2239 Melanocytic nevi of other parts of face: Secondary | ICD-10-CM | POA: Diagnosis not present

## 2022-11-29 DIAGNOSIS — L821 Other seborrheic keratosis: Secondary | ICD-10-CM | POA: Diagnosis not present

## 2022-11-29 DIAGNOSIS — L570 Actinic keratosis: Secondary | ICD-10-CM | POA: Diagnosis not present

## 2022-11-29 DIAGNOSIS — L538 Other specified erythematous conditions: Secondary | ICD-10-CM | POA: Diagnosis not present

## 2022-11-29 DIAGNOSIS — Z85828 Personal history of other malignant neoplasm of skin: Secondary | ICD-10-CM | POA: Diagnosis not present

## 2022-11-29 DIAGNOSIS — Z872 Personal history of diseases of the skin and subcutaneous tissue: Secondary | ICD-10-CM | POA: Diagnosis not present

## 2022-12-18 DIAGNOSIS — I10 Essential (primary) hypertension: Secondary | ICD-10-CM | POA: Diagnosis not present

## 2023-01-08 DIAGNOSIS — J310 Chronic rhinitis: Secondary | ICD-10-CM | POA: Diagnosis not present

## 2023-01-08 DIAGNOSIS — J343 Hypertrophy of nasal turbinates: Secondary | ICD-10-CM | POA: Diagnosis not present

## 2023-02-12 DIAGNOSIS — E1169 Type 2 diabetes mellitus with other specified complication: Secondary | ICD-10-CM | POA: Diagnosis not present

## 2023-02-12 DIAGNOSIS — I10 Essential (primary) hypertension: Secondary | ICD-10-CM | POA: Diagnosis not present

## 2023-02-12 DIAGNOSIS — M545 Low back pain, unspecified: Secondary | ICD-10-CM | POA: Diagnosis not present

## 2023-02-12 DIAGNOSIS — G250 Essential tremor: Secondary | ICD-10-CM | POA: Diagnosis not present

## 2023-02-12 DIAGNOSIS — G40909 Epilepsy, unspecified, not intractable, without status epilepticus: Secondary | ICD-10-CM | POA: Diagnosis not present

## 2023-02-12 DIAGNOSIS — I77811 Abdominal aortic ectasia: Secondary | ICD-10-CM | POA: Diagnosis not present

## 2023-02-12 DIAGNOSIS — Z0000 Encounter for general adult medical examination without abnormal findings: Secondary | ICD-10-CM | POA: Diagnosis not present

## 2023-02-12 DIAGNOSIS — Z79899 Other long term (current) drug therapy: Secondary | ICD-10-CM | POA: Diagnosis not present

## 2023-02-12 DIAGNOSIS — E782 Mixed hyperlipidemia: Secondary | ICD-10-CM | POA: Diagnosis not present

## 2023-02-12 DIAGNOSIS — E119 Type 2 diabetes mellitus without complications: Secondary | ICD-10-CM | POA: Diagnosis not present

## 2023-02-28 DIAGNOSIS — G40909 Epilepsy, unspecified, not intractable, without status epilepticus: Secondary | ICD-10-CM | POA: Diagnosis not present

## 2023-03-28 ENCOUNTER — Ambulatory Visit (INDEPENDENT_AMBULATORY_CARE_PROVIDER_SITE_OTHER): Payer: Medicare PPO

## 2023-03-28 ENCOUNTER — Ambulatory Visit: Payer: Medicare PPO | Admitting: Podiatry

## 2023-03-28 ENCOUNTER — Encounter: Payer: Self-pay | Admitting: Podiatry

## 2023-03-28 ENCOUNTER — Ambulatory Visit: Payer: Medicare PPO

## 2023-03-28 DIAGNOSIS — E1142 Type 2 diabetes mellitus with diabetic polyneuropathy: Secondary | ICD-10-CM | POA: Diagnosis not present

## 2023-03-28 DIAGNOSIS — R0989 Other specified symptoms and signs involving the circulatory and respiratory systems: Secondary | ICD-10-CM

## 2023-03-28 DIAGNOSIS — L603 Nail dystrophy: Secondary | ICD-10-CM | POA: Diagnosis not present

## 2023-03-28 DIAGNOSIS — M778 Other enthesopathies, not elsewhere classified: Secondary | ICD-10-CM

## 2023-03-28 DIAGNOSIS — L84 Corns and callosities: Secondary | ICD-10-CM | POA: Diagnosis not present

## 2023-03-28 NOTE — Progress Notes (Signed)
Subjective:   Patient ID: Kenneth Gonzalez, male   DOB: 82 y.o.   MRN: [id number]   HPI Patient presents severe cavus foot structure walks on the outside of his feet and has developed lesions that are very tender with long-term diabetes and neuro logical like processes.  States it has been very sore and also complains of nail disease 1-5 both feet that are thick and he cannot cut.  Patient does not smoke currently likes to be active   Review of Systems  All other systems reviewed and are negative.       Objective:  Physical Exam Vitals and nursing note reviewed.  Constitutional:      Appearance: He is well-developed.  Pulmonary:     Effort: Pulmonary effort is normal.  Musculoskeletal:        General: Normal range of motion.  Skin:    General: Skin is warm.  Neurological:     Mental Status: He is alert.     Neurovascular status intact muscle strength was found to be adequate range of motion adequate with diminishment sharp dull vibratory and significant cavus foot structure with adductovarus deformity pressure against the base of the fifth metatarsals bilateral with keratotic lesions preulcerative that are painful when pressed and thick yellow brittle nailbeds 1-5 both feet     Assessment:  Chronic mycotic nail infection with pain 1-5 both feet cavus foot structure lesion formation at risk due to long-term diabetes with neurological issues     Plan:  H&P reviewed all conditions debrided lesion formation bilateral and recommended diabetic shoes to offload weight from the base of the fifth metatarsal.  Patient will be scheduled for this and also nail debridement accomplished today Neutra genic bleeding and education concerning diabetes

## 2023-03-28 NOTE — Progress Notes (Signed)
Patient presents to the office today for diabetic shoe and insole measuring.  Patient was measured with brannock device to determine size and width for 1 pair of extra depth shoes and scanned for 3 pair of insoles.   Documentation of medical necessity will be sent to patient's treating diabetic doctor to verify and sign.   Patient's diabetic provider: Eleanora Neighbor   Shoes and insoles will be ordered at that time and patient will be notified for an appointment for fitting when they arrive.   Shoe size (per patient): 8 Brannock measurement: 7.5-8  Patient shoe selection- Shoe choice:   [id number] Shoe size ordered: 8XWD  ABN and Financials signed  Patient scanned and scans sent to safestep Addison Bailey Cped, CFo, CFm

## 2023-04-02 DIAGNOSIS — G40909 Epilepsy, unspecified, not intractable, without status epilepticus: Secondary | ICD-10-CM | POA: Diagnosis not present

## 2023-04-26 ENCOUNTER — Telehealth: Payer: Self-pay | Admitting: Podiatry

## 2023-04-26 NOTE — Telephone Encounter (Signed)
Pts wife called checking on pts diabetic shoes status

## 2023-04-29 NOTE — Progress Notes (Signed)
Shoes and inserts are in called patient LM then called cell phone appt was already set for 10/25 at 11Am  [place]

## 2023-05-08 DIAGNOSIS — Z79899 Other long term (current) drug therapy: Secondary | ICD-10-CM | POA: Diagnosis not present

## 2023-05-10 ENCOUNTER — Other Ambulatory Visit: Payer: Medicare PPO

## 2023-05-11 ENCOUNTER — Ambulatory Visit (INDEPENDENT_AMBULATORY_CARE_PROVIDER_SITE_OTHER): Payer: Medicare PPO

## 2023-05-11 DIAGNOSIS — E1142 Type 2 diabetes mellitus with diabetic polyneuropathy: Secondary | ICD-10-CM | POA: Diagnosis not present

## 2023-05-11 DIAGNOSIS — L84 Corns and callosities: Secondary | ICD-10-CM | POA: Diagnosis not present

## 2023-05-11 DIAGNOSIS — M778 Other enthesopathies, not elsewhere classified: Secondary | ICD-10-CM

## 2023-05-11 DIAGNOSIS — M7752 Other enthesopathy of left foot: Secondary | ICD-10-CM

## 2023-05-11 DIAGNOSIS — M7751 Other enthesopathy of right foot: Secondary | ICD-10-CM | POA: Diagnosis not present

## 2023-05-11 NOTE — Progress Notes (Signed)

## 2023-05-17 DIAGNOSIS — T148XXA Other injury of unspecified body region, initial encounter: Secondary | ICD-10-CM | POA: Diagnosis not present

## 2023-05-17 DIAGNOSIS — S5011XA Contusion of right forearm, initial encounter: Secondary | ICD-10-CM | POA: Diagnosis not present

## 2023-05-24 DIAGNOSIS — S5011XA Contusion of right forearm, initial encounter: Secondary | ICD-10-CM | POA: Diagnosis not present

## 2023-05-24 DIAGNOSIS — T148XXA Other injury of unspecified body region, initial encounter: Secondary | ICD-10-CM | POA: Diagnosis not present

## 2023-07-03 DIAGNOSIS — D2239 Melanocytic nevi of other parts of face: Secondary | ICD-10-CM | POA: Diagnosis not present

## 2023-07-03 DIAGNOSIS — L821 Other seborrheic keratosis: Secondary | ICD-10-CM | POA: Diagnosis not present

## 2023-07-03 DIAGNOSIS — L570 Actinic keratosis: Secondary | ICD-10-CM | POA: Diagnosis not present

## 2023-07-03 DIAGNOSIS — D225 Melanocytic nevi of trunk: Secondary | ICD-10-CM | POA: Diagnosis not present

## 2023-07-03 DIAGNOSIS — L814 Other melanin hyperpigmentation: Secondary | ICD-10-CM | POA: Diagnosis not present

## 2023-07-25 ENCOUNTER — Ambulatory Visit (INDEPENDENT_AMBULATORY_CARE_PROVIDER_SITE_OTHER): Payer: Medicare PPO | Admitting: Podiatry

## 2023-07-25 ENCOUNTER — Encounter: Payer: Self-pay | Admitting: Podiatry

## 2023-07-25 DIAGNOSIS — L84 Corns and callosities: Secondary | ICD-10-CM | POA: Diagnosis not present

## 2023-07-25 DIAGNOSIS — M79675 Pain in left toe(s): Secondary | ICD-10-CM

## 2023-07-25 DIAGNOSIS — B351 Tinea unguium: Secondary | ICD-10-CM | POA: Diagnosis not present

## 2023-07-25 DIAGNOSIS — E1142 Type 2 diabetes mellitus with diabetic polyneuropathy: Secondary | ICD-10-CM

## 2023-07-25 DIAGNOSIS — M79674 Pain in right toe(s): Secondary | ICD-10-CM | POA: Diagnosis not present

## 2023-08-01 NOTE — Progress Notes (Signed)
  Subjective:  Patient ID: Kenneth Gonzalez, male    DOB: May 23, 1941,  MRN: [id number]  Kenneth Gonzalez presents to clinic today for at risk foot care with history of diabetic neuropathy and callus(es) of both feet and painful thick toenails that are difficult to trim. Painful toenails interfere with ambulation. Aggravating factors include wearing enclosed shoe gear. Pain is relieved with periodic professional debridement. Painful calluses are aggravated when weightbearing with and without shoegear. Pain is relieved with periodic professional debridement.  Chief Complaint  Patient presents with   [facility]    RM#[facility]   New problem(s): None.   PCP is Roseann Coad, MD (Inactive).  No Known Allergies  Review of Systems: Negative except as noted in the HPI.  Objective: No changes noted in today's physical examination. There were no vitals filed for this visit. Kenneth Gonzalez is a pleasant 83 y.o. male in NAD. AAO x 3.  Vascular Examination: CFT <3 seconds b/l. DP/PT pulses faintly palpable b/l. Skin temperature gradient warm to warm b/l. No pain with calf compression. No ischemia or gangrene. No cyanosis or clubbing noted b/l.    Neurological Examination: Protective sensation diminished with 10g monofilament b/l. Vibratory sensation diminished b/l.  Dermatological Examination: Pedal skin warm and supple b/l.   No open wounds. No interdigital macerations.  Toenails 1-5 b/l thick, discolored, elongated with subungual debris and pain on dorsal palpation.    Hyperkeratotic lesion(s) submet head 5 b/l.  No erythema, no edema, no drainage, no fluctuance.  Musculoskeletal Examination: Muscle strength 5/5 to all lower extremity muscle groups bilaterally. 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  Radiographs: None  Assessment/Plan: 1. 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 Callus   3. Diabetic polyneuropathy associated with type 2 diabetes mellitus (HCC)      -Consent given for treatment as described below: -Examined patient. -Toenails 1-5 b/l were debrided in length and girth with sterile nail nippers and dremel without iatrogenic bleeding.  -Callus(es) b/l feet pared utilizing sharp debridement with sterile blade without complication or incident. Total number debrided =2. -Patient/POA to call should there be question/concern in the interim.   Return in about 3 months (around 10/23/2023).  Delon LITTIE Merlin, DPM      [place] LOCATION: [address], [place] [address]                   Office [phone]   [facility] LOCATION: [address] [place], [place] [address] Office [phone]

## 2023-08-13 DIAGNOSIS — E782 Mixed hyperlipidemia: Secondary | ICD-10-CM | POA: Diagnosis not present

## 2023-08-13 DIAGNOSIS — E1169 Type 2 diabetes mellitus with other specified complication: Secondary | ICD-10-CM | POA: Diagnosis not present

## 2023-08-13 DIAGNOSIS — G250 Essential tremor: Secondary | ICD-10-CM | POA: Diagnosis not present

## 2023-08-13 DIAGNOSIS — Z23 Encounter for immunization: Secondary | ICD-10-CM | POA: Diagnosis not present

## 2023-08-13 DIAGNOSIS — I10 Essential (primary) hypertension: Secondary | ICD-10-CM | POA: Diagnosis not present

## 2023-08-13 DIAGNOSIS — I77811 Abdominal aortic ectasia: Secondary | ICD-10-CM | POA: Diagnosis not present

## 2023-08-13 DIAGNOSIS — G40909 Epilepsy, unspecified, not intractable, without status epilepticus: Secondary | ICD-10-CM | POA: Diagnosis not present

## 2023-08-20 ENCOUNTER — Ambulatory Visit: Payer: Medicare PPO

## 2023-08-20 NOTE — Progress Notes (Signed)
Patient and wife present for consult to see what can help patients lateral ankle instability Patient has Cavus arch structure causing over supination and lateral ankle weakness causing patient to fall when working outside in the yard Left over right worse but right is slowly progressing  I explained different options  Lateral Shoe flares, Double upright for Left and possible [place] (articulating ) Gaunlets as well  Patient has good ROM ankle is correctable to STN I would not want to limit his dorsi / plantar flexion but is in need of and Medial and lateral stability  Appt. Set with me on 09/17/23 9:30 am for casting  I will talk with Dr Eloy End to create order   Addison Bailey CPed, CFo, CFm

## 2023-09-17 ENCOUNTER — Ambulatory Visit: Payer: Medicare PPO

## 2023-09-17 NOTE — Progress Notes (Unsigned)
 Patient was here and casted for BIL [place] braces  Cavus Foot structure present with over Supination  Weakness has developed in lateral aspect of Both ankles giving out on patient has has fallen several times he reports  Articulating AFO with help stabilize  Prior auth request being sent to [facility]  L1970 x2units  [id number] x2units  [id number] x2units   Kenneth Gonzalez Cped, CFo, CFm

## 2023-09-27 ENCOUNTER — Other Ambulatory Visit (INDEPENDENT_AMBULATORY_CARE_PROVIDER_SITE_OTHER): Admitting: Podiatry

## 2023-09-27 DIAGNOSIS — M25371 Other instability, right ankle: Secondary | ICD-10-CM

## 2023-09-27 DIAGNOSIS — Q6671 Congenital pes cavus, right foot: Secondary | ICD-10-CM

## 2023-09-27 DIAGNOSIS — Q6672 Congenital pes cavus, left foot: Secondary | ICD-10-CM

## 2023-09-27 DIAGNOSIS — R262 Difficulty in walking, not elsewhere classified: Secondary | ICD-10-CM

## 2023-09-27 DIAGNOSIS — M25372 Other instability, left ankle: Secondary | ICD-10-CM

## 2023-09-27 NOTE — Progress Notes (Signed)
 1. Difficulty in walking involving ankle and foot joint   2. Pes cavus of both feet   3. Ankle instability, left   4. Ankle instability, right    Orders Placed This Encounter  Procedures   For home use only DME Other see comment    Dispense bilateral [place] articulating AFOs    Length of Need:   [employer]

## 2023-10-20 DIAGNOSIS — J189 Pneumonia, unspecified organism: Secondary | ICD-10-CM | POA: Diagnosis not present

## 2023-10-20 DIAGNOSIS — R051 Acute cough: Secondary | ICD-10-CM | POA: Diagnosis not present

## 2023-10-20 DIAGNOSIS — R0781 Pleurodynia: Secondary | ICD-10-CM | POA: Diagnosis not present

## 2023-10-21 ENCOUNTER — Encounter (HOSPITAL_BASED_OUTPATIENT_CLINIC_OR_DEPARTMENT_OTHER): Payer: Self-pay | Admitting: Emergency Medicine

## 2023-10-21 ENCOUNTER — Emergency Department (HOSPITAL_BASED_OUTPATIENT_CLINIC_OR_DEPARTMENT_OTHER)

## 2023-10-21 ENCOUNTER — Other Ambulatory Visit: Payer: Self-pay

## 2023-10-21 ENCOUNTER — Emergency Department (HOSPITAL_BASED_OUTPATIENT_CLINIC_OR_DEPARTMENT_OTHER)
Admission: EM | Admit: 2023-10-21 | Discharge: 2023-10-21 | Disposition: A | Discharge status: Home / Self Care | Attending: Emergency Medicine | Admitting: Emergency Medicine

## 2023-10-21 DIAGNOSIS — U071 COVID-19: Secondary | ICD-10-CM | POA: Diagnosis not present

## 2023-10-21 DIAGNOSIS — Z7984 Long term (current) use of oral hypoglycemic drugs: Secondary | ICD-10-CM | POA: Diagnosis not present

## 2023-10-21 DIAGNOSIS — I10 Essential (primary) hypertension: Secondary | ICD-10-CM | POA: Insufficient documentation

## 2023-10-21 DIAGNOSIS — Z79899 Other long term (current) drug therapy: Secondary | ICD-10-CM | POA: Insufficient documentation

## 2023-10-21 DIAGNOSIS — E119 Type 2 diabetes mellitus without complications: Secondary | ICD-10-CM | POA: Diagnosis not present

## 2023-10-21 DIAGNOSIS — R5381 Other malaise: Secondary | ICD-10-CM | POA: Diagnosis not present

## 2023-10-21 DIAGNOSIS — Z85828 Personal history of other malignant neoplasm of skin: Secondary | ICD-10-CM | POA: Diagnosis not present

## 2023-10-21 DIAGNOSIS — R52 Pain, unspecified: Secondary | ICD-10-CM | POA: Diagnosis present

## 2023-10-21 LAB — CBC WITH DIFFERENTIAL/PLATELET
Abs Immature Granulocytes: 0.01 10*3/uL (ref 0.00–0.07)
Basophils Absolute: 0 10*3/uL (ref 0.0–0.1)
Basophils Relative: 1 %
Eosinophils Absolute: 0 10*3/uL (ref 0.0–0.5)
Eosinophils Relative: 1 %
HCT: 42.8 % (ref 39.0–52.0)
Hemoglobin: 14.9 g/dL (ref 13.0–17.0)
Immature Granulocytes: 0 %
Lymphocytes Relative: 32 %
Lymphs Abs: 1.7 10*3/uL (ref 0.7–4.0)
MCH: 30.5 pg (ref 26.0–34.0)
MCHC: 34.8 g/dL (ref 30.0–36.0)
MCV: 87.7 fL (ref 80.0–100.0)
Monocytes Absolute: 0.8 10*3/uL (ref 0.1–1.0)
Monocytes Relative: 14 %
Neutro Abs: 2.8 10*3/uL (ref 1.7–7.7)
Neutrophils Relative %: 52 %
Platelets: 217 10*3/uL (ref 150–400)
RBC: 4.88 MIL/uL (ref 4.22–5.81)
RDW: 12.4 % (ref 11.5–15.5)
WBC: 5.4 10*3/uL (ref 4.0–10.5)
nRBC: 0 % (ref 0.0–0.2)

## 2023-10-21 LAB — COMPREHENSIVE METABOLIC PANEL WITH GFR
ALT: 36 U/L (ref 0–44)
AST: 43 U/L — ABNORMAL HIGH (ref 15–41)
Albumin: 3.8 g/dL (ref 3.5–5.0)
Alkaline Phosphatase: 68 U/L (ref 38–126)
Anion gap: 13 (ref 5–15)
BUN: 23 mg/dL (ref 8–23)
CO2: 27 mmol/L (ref 22–32)
Calcium: 9.1 mg/dL (ref 8.9–10.3)
Chloride: 99 mmol/L (ref 98–111)
Creatinine, Ser: 1.25 mg/dL — ABNORMAL HIGH (ref 0.61–1.24)
GFR, Estimated: 57 mL/min — ABNORMAL LOW (ref >60)
Glucose, Bld: 159 mg/dL — ABNORMAL HIGH (ref 70–99)
Potassium: 3.7 mmol/L (ref 3.5–5.1)
Sodium: 139 mmol/L (ref 135–145)
Total Bilirubin: 0.5 mg/dL (ref 0.0–1.2)
Total Protein: 7.3 g/dL (ref 6.5–8.1)

## 2023-10-21 LAB — LACTIC ACID, PLASMA: Lactic Acid, Venous: 1.2 mmol/L (ref 0.5–1.9)

## 2023-10-21 LAB — RESP PANEL BY RT-PCR (RSV, FLU A&B, COVID)  RVPGX2
Influenza A by PCR: NEGATIVE
Influenza B by PCR: NEGATIVE
Resp Syncytial Virus by PCR: NEGATIVE
SARS Coronavirus 2 by RT PCR: POSITIVE — AB

## 2023-10-21 MED ORDER — MOLNUPIRAVIR EUA 200MG CAPSULE: 4.0000 | ORAL_CAPSULE | Freq: Two times a day (BID) | ORAL | 0 refills | Status: AC

## 2023-10-21 NOTE — ED Triage Notes (Signed)
 Pt reports going to UC yesterday, was dx with "a touch of PNA", started taking Augmentin yesterday, still feeling bad today, c/o body aches; denies ShoB, pt in NAD in triage

## 2023-10-21 NOTE — Discharge Instructions (Addendum)
 Your workup today was reassuring.  Blood work, chest x-ray did not show any concerning findings.  Notably no pneumonia.  You did test positive for COVID.  I have sent in a medication called molnupiravir into the pharmacy for you.  Follow-up with your primary care doctor in the next 2 to 3 days for reevaluation.  For any worsening symptoms return to the emergency room.

## 2023-10-21 NOTE — ED Provider Notes (Signed)
 [place] [facility] Provider Note   CSN: [id number] Arrival date & time: 10/21/23  1402     History  Chief Complaint  Patient presents with   Pneumonia    Kenneth Gonzalez is a 83 y.o. male.  83 year old male presents today with his wife for concern of respiratory complaints.  His wife has been sick for 2 weeks and shortly after she became sick he picked up similar symptoms.  He was seen at urgent care yesterday and was diagnosed with potential pneumonia and was started on Augmentin.  His wife started to feel worse since yesterday after she was evaluated and was started on Augmentin.  She checked in for evaluation which prompted the patient to also check in and be evaluated as well.  He states he does not feel significantly worse compared to yesterday.  Denies fever, productive cough.  Denies chest pain, or shortness of breath.  The history is provided by the patient. No language interpreter was used.       Home Medications Prior to Admission medications   Medication Sig Start Date End Date Taking? Authorizing Provider  molnupiravir EUA (LAGEVRIO) 200 mg CAPS capsule Take 4 capsules (800 mg total) by mouth 2 (two) times daily for 5 days. 10/21/23 10/26/23 Yes Jaleyah Longhi, PA-C  lisinopril (ZESTRIL) 5 MG tablet Take 5 mg by mouth daily. 10/02/21   [provider]  metFORMIN (GLUCOPHAGE-XR) 500 MG 24 hr tablet Take 500 mg by mouth daily. 12/01/13   [provider]  montelukast (SINGULAIR) 10 MG tablet Take 1 tablet by mouth daily. 08/11/21   [provider]  Multiple Vitamins-Minerals (PRESERVISION AREDS 2 PO) Take 1 capsule by mouth 2 (two) times daily.    [provider]  OVER THE COUNTER MEDICATION Take 2 capsules by mouth 2 (two) times daily. Omega XL    [provider]  OVER THE COUNTER MEDICATION Apply 1 application. topically daily as needed (pain). Hemp cream    [provider]  phenytoin  (DILANTIN) 100 MG ER capsule Take 300 mg by mouth at bedtime.    [provider]  pravastatin (PRAVACHOL) 20 MG tablet Take 20 mg by mouth daily.    [provider]      Allergies    Patient has no known allergies.    Review of Systems   Review of Systems  Constitutional:  Negative for chills and fever.  HENT:  Negative for sore throat.   Respiratory:  Positive for cough. Negative for shortness of breath.   Cardiovascular:  Negative for chest pain.  Neurological:  Negative for light-headedness.  All other systems reviewed and are negative.   Physical Exam Updated Vital Signs BP 136/77 (BP Location: Left Arm)   Pulse 62   Temp 98.1 F (36.7 C) (Oral)   Resp 18   Ht 5\' 6"  (1.676 m)   Wt 90.3 kg   SpO2 98%   BMI 32.12 kg/m  Physical Exam Vitals and nursing note reviewed.  Constitutional:      General: He is not in acute distress.    Appearance: Normal appearance. He is not ill-appearing.  HENT:     Head: Normocephalic and atraumatic.     Nose: Nose normal.     Mouth/Throat:     Mouth: Mucous membranes are moist.     Pharynx: No oropharyngeal exudate or posterior oropharyngeal erythema.  Eyes:     Conjunctiva/sclera: Conjunctivae normal.  Cardiovascular:  Rate and Rhythm: Normal rate and regular rhythm.  Pulmonary:     Effort: Pulmonary effort is normal. No respiratory distress.     Breath sounds: Normal breath sounds. No wheezing or rales.  Musculoskeletal:        General: No deformity. Normal range of motion.     Cervical back: Normal range of motion.  Skin:    Findings: No rash.  Neurological:     Mental Status: He is alert.     ED Results / Procedures / Treatments   Labs (all labs ordered are listed, but only abnormal results are displayed) Labs Reviewed  RESP PANEL BY RT-PCR (RSV, FLU A&B, COVID)  RVPGX2 - Abnormal; Notable for the following components:      Result Value   SARS Coronavirus 2 by RT PCR POSITIVE (*)    All other  components within normal limits  COMPREHENSIVE METABOLIC PANEL WITH GFR - Abnormal; Notable for the following components:   Glucose, Bld 159 (*)    Creatinine, Ser 1.25 (*)    AST 43 (*)    GFR, Estimated 57 (*)    All other components within normal limits  LACTIC ACID, PLASMA  CBC WITH DIFFERENTIAL/PLATELET  LACTIC ACID, PLASMA    EKG None  Radiology DG Chest 2 View Result Date: 10/21/2023 CLINICAL DATA:  Pneumonia.  Malaise. EXAM: CHEST - 2 VIEW COMPARISON:  10/20/2023 FINDINGS: The heart size and mediastinal contours are within normal limits. Mild bibasilar scarring noted. No evidence of pulmonary consolidation or edema. No pleural effusion. IMPRESSION: No active cardiopulmonary disease. Electronically Signed   By: Danae Orleans M.D.   On: 10/21/2023 15:25    Procedures Procedures    Medications Ordered in ED Medications - No data to display  ED Course/ Medical Decision Making/ A&P                                 Medical Decision Making Amount and/or Complexity of Data Reviewed Labs: ordered. Radiology: ordered.   Medical Decision Making / ED Course   This patient presents to the ED for concern of cough, congestion, shortness of breath, this involves an extensive number of treatment options, and is a complaint that carries with it a high risk of complications and morbidity.  The differential diagnosis includes viral URI, pneumonia, PE  MDM: 83 year old male presents with his wife for concern of persistent upper respiratory infection symptoms.  He was checked at an urgent care yesterday and was started on Augmentin for potential pneumonia.  His workup today reveals a chest x-ray with no evidence of pneumonia.  Blood work which is overall reassuring with minimal renal insufficiency.  Respiratory panel positive for COVID.  Satting 98% on room air.  Without tachycardia.  Speaking in complete sentences without issue.  Afebrile.  Will prescribe molnupiravir.  No indication for  admission at this time.  Discussed close follow-up with PCP for reevaluation in 2 to 3 days.  Patient voices understanding and is in agreement with plan. Discussed he can stop taking the Augmentin given there is no evidence of pneumonia.  Additional history obtained: -Additional history obtained from wife at bedside -External records from outside source obtained and reviewed including: Chart review including previous notes, labs, imaging, consultation notes   Lab Tests: -I ordered, reviewed, and interpreted labs.   The pertinent results include:   Labs Reviewed  RESP PANEL BY RT-PCR (RSV, FLU A&B, COVID)  RVPGX2 -  Abnormal; Notable for the following components:      Result Value   SARS Coronavirus 2 by RT PCR POSITIVE (*)    All other components within normal limits  COMPREHENSIVE METABOLIC PANEL WITH GFR - Abnormal; Notable for the following components:   Glucose, Bld 159 (*)    Creatinine, Ser 1.25 (*)    AST 43 (*)    GFR, Estimated 57 (*)    All other components within normal limits  LACTIC ACID, PLASMA  CBC WITH DIFFERENTIAL/PLATELET  LACTIC ACID, PLASMA      EKG  EKG Interpretation Date/Time:    Ventricular Rate:    PR Interval:    QRS Duration:    QT Interval:    QTC Calculation:   R Axis:      Text Interpretation:           Imaging Studies ordered: I ordered imaging studies including cxr I independently visualized and interpreted imaging. I agree with the radiologist interpretation   Medicines ordered and prescription drug management: Meds ordered this encounter  Medications   molnupiravir EUA (LAGEVRIO) 200 mg CAPS capsule    Sig: Take 4 capsules (800 mg total) by mouth 2 (two) times daily for 5 days.    Dispense:  40 capsule    Refill:  0    Supervising Provider:   Eber Hong [[id number]]    -I have reviewed the patients home medicines and have made adjustments as needed  Social Determinants of Health:  Factors impacting patients care include:  Good family support, good follow-up established   Reevaluation: After the interventions noted above, I reevaluated the patient and found that they have :stayed the same Remained without distress in the emergency department.  Ambulates without difficulty.  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  Return precaution discussed.  Discussed close PCP follow-up.  Final Clinical Impression(s) / ED Diagnoses Final diagnoses:  COVID-19    Rx / DC Orders ED Discharge Orders          Ordered    molnupiravir EUA (LAGEVRIO) 200 mg CAPS capsule  2 times daily        10/21/23 1646              Marita Kansas, PA-C 10/21/23 1657    Benjiman Core, MD 10/21/23 2230

## 2023-10-22 ENCOUNTER — Telehealth (HOSPITAL_BASED_OUTPATIENT_CLINIC_OR_DEPARTMENT_OTHER): Payer: Self-pay | Admitting: Emergency Medicine

## 2023-10-22 NOTE — Telephone Encounter (Cosign Needed)
 Patient's wife called stating that insurance was unable to cover patient's prescription and it would cost $1800. According to wife patient feels no worse than when he was evaluated.  Discussed since he was vaccinated and his remainder of the workup including blood work, chest x-ray was reassuring that it is okay if he does not take this medicine.  Discussed follow-up with the PCP within the next 2 days.  Discussed return to the emergency department for any worsening symptoms.  She voices understanding and is in agreement with the plan.

## 2023-10-22 NOTE — ED Notes (Signed)
 Patient called and stated unable to obtain prescription due to insurance and needs alternate perscripton called in.  Gave information to ED Physian.

## 2023-10-25 IMAGING — CR DG CHEST 2V
2 series · 2 of 2 positions shown · non-contrast
Comparison: 09/17/2017.

CLINICAL DATA: EKG changes, intermittent left-sided chest pain.

EXAM:
CHEST - 2 VIEW

[chest pa]
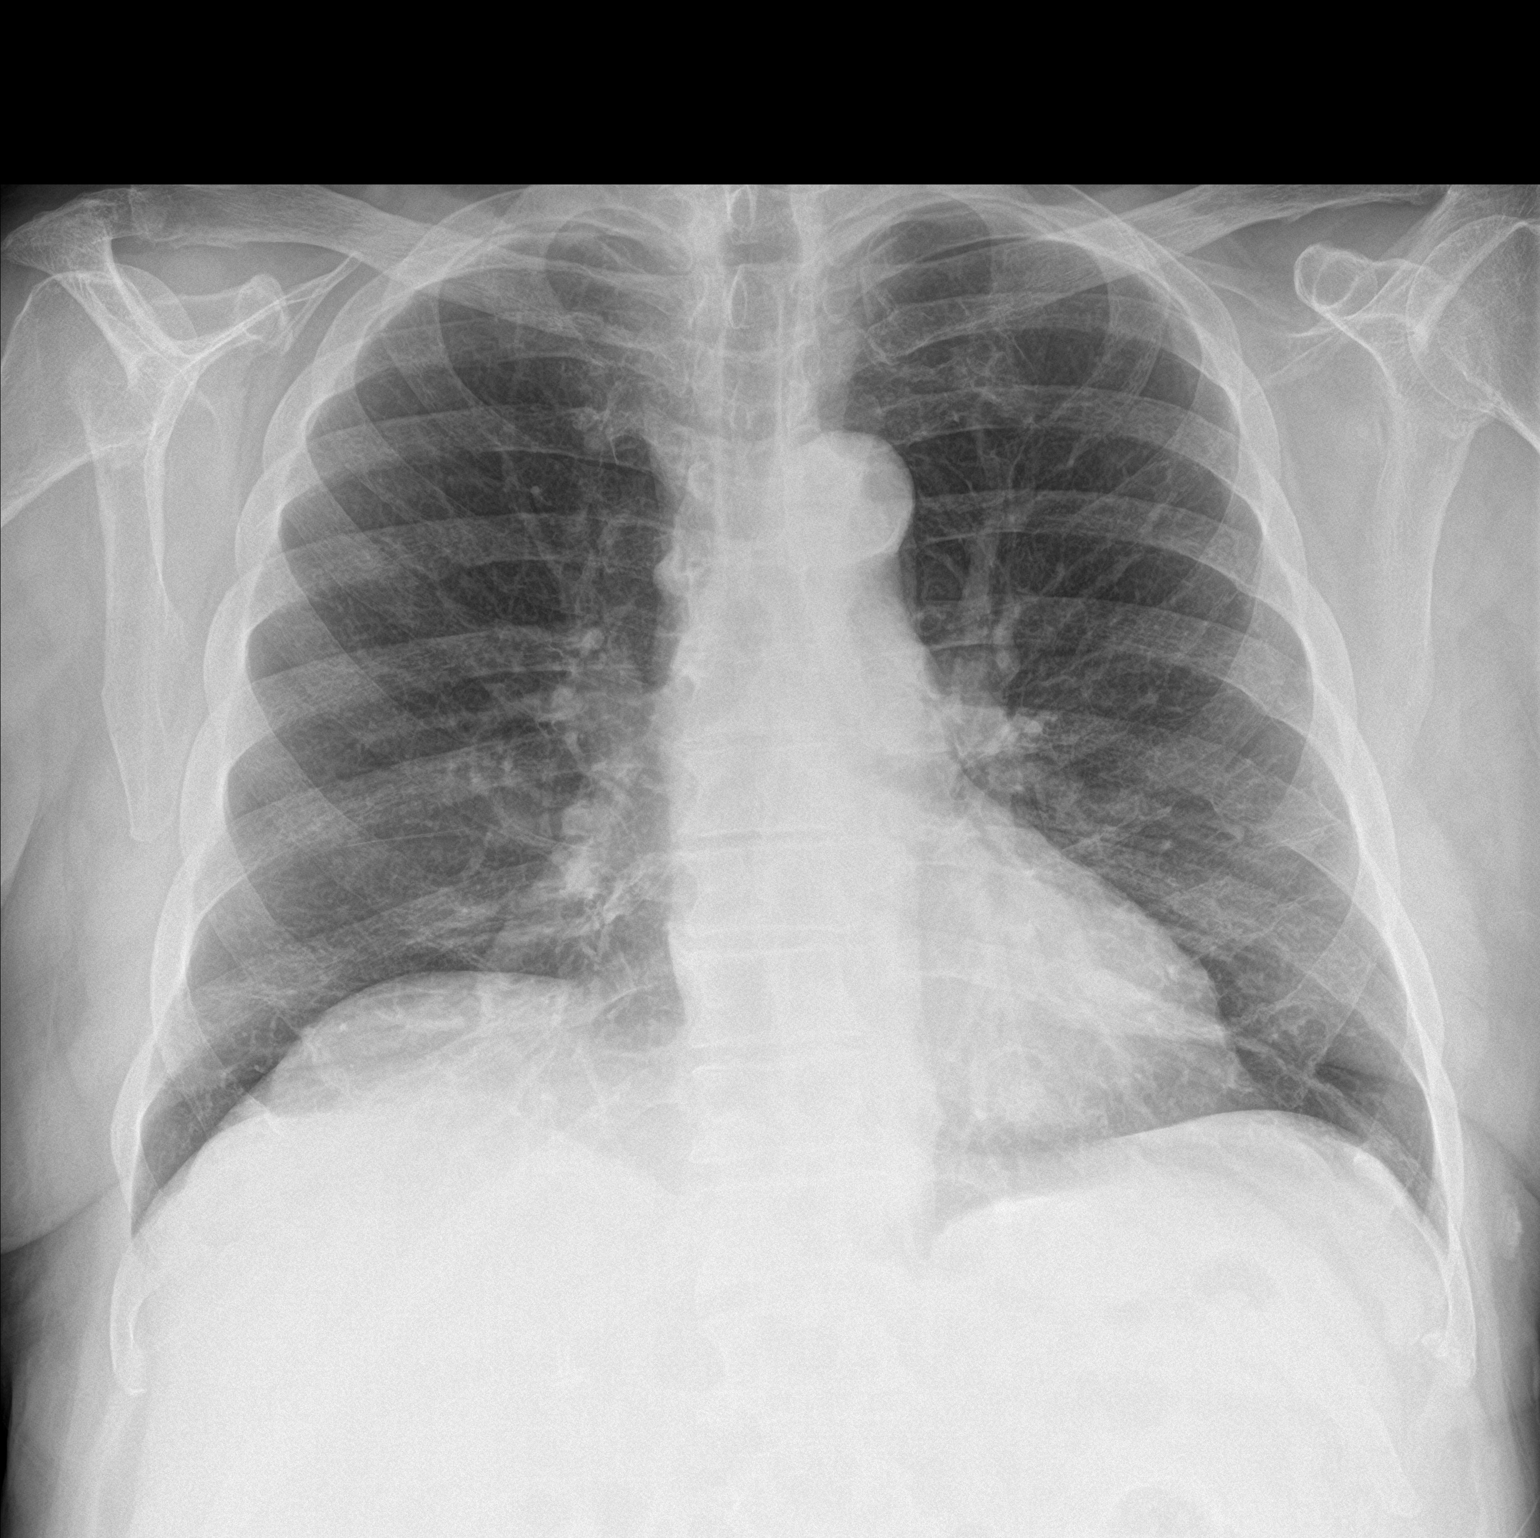

[chest lat]
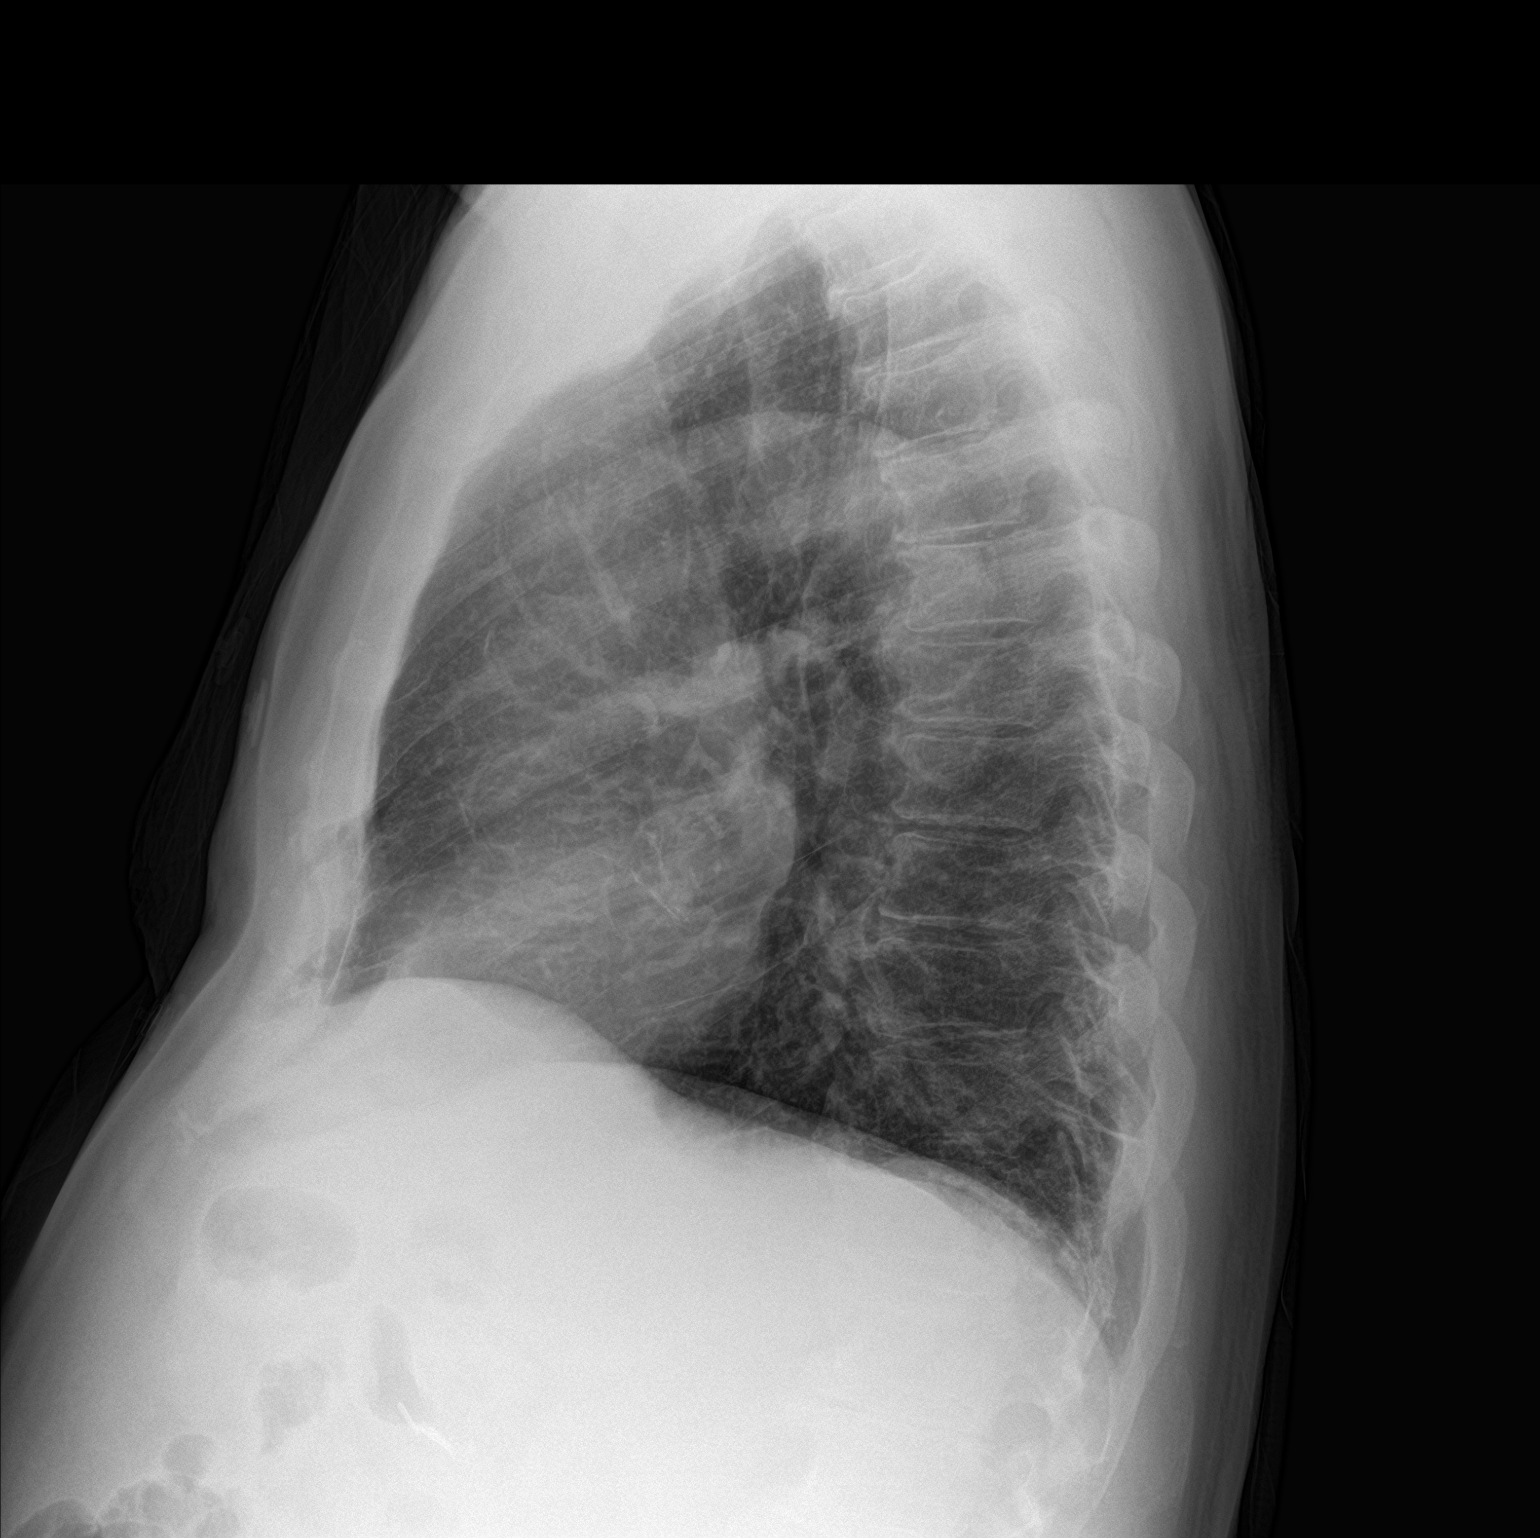

[2 of 2 positions shown; findings below may reference images not displayed]

FINDINGS: Trachea is midline. Heart size normal. Minimal streaky atelectasis
or scarring in the lung bases. No wrist with consolidation or
pleural fluid. Old left rib fractures.
IMPRESSION: No acute findings.

## 2023-10-27 DIAGNOSIS — J0100 Acute maxillary sinusitis, unspecified: Secondary | ICD-10-CM | POA: Diagnosis not present

## 2023-10-29 DIAGNOSIS — J0111 Acute recurrent frontal sinusitis: Secondary | ICD-10-CM | POA: Diagnosis not present

## 2023-11-12 DIAGNOSIS — E119 Type 2 diabetes mellitus without complications: Secondary | ICD-10-CM | POA: Diagnosis not present

## 2023-11-12 DIAGNOSIS — H524 Presbyopia: Secondary | ICD-10-CM | POA: Diagnosis not present

## 2023-11-13 ENCOUNTER — Encounter: Payer: Self-pay | Admitting: Podiatry

## 2023-11-13 ENCOUNTER — Ambulatory Visit: Payer: Medicare PPO | Admitting: Podiatry

## 2023-11-13 DIAGNOSIS — B351 Tinea unguium: Secondary | ICD-10-CM

## 2023-11-13 DIAGNOSIS — E1142 Type 2 diabetes mellitus with diabetic polyneuropathy: Secondary | ICD-10-CM

## 2023-11-13 DIAGNOSIS — M79675 Pain in left toe(s): Secondary | ICD-10-CM

## 2023-11-13 DIAGNOSIS — M79674 Pain in right toe(s): Secondary | ICD-10-CM | POA: Diagnosis not present

## 2023-11-13 DIAGNOSIS — L84 Corns and callosities: Secondary | ICD-10-CM

## 2023-11-18 NOTE — Progress Notes (Signed)
  Subjective:  Patient ID: Kenneth Gonzalez, male    DOB: [birth year],  MRN: [id number]  ARVIS WARMKESSEL presents to clinic today for at risk foot care with history of diabetic neuropathy and callus(es) b/l feet and painful mycotic toenails that are difficult to trim. Painful toenails interfere with ambulation. Aggravating factors include wearing enclosed shoe gear. Pain is relieved with periodic professional debridement. Painful calluses are aggravated when weightbearing with and without shoegear. Pain is relieved with periodic professional debridement. He states he and his wife have been ill for the past few weeks. Chief Complaint  Patient presents with   routine foot care    Rm 17 Diabetic/NIDDm/Last blood sugar 180/last A1C? PCP Dr. Teofilo Fellers 1 wk ago/ patient says that he has been sick for 3 weeks symptoms fatigue and body aches   New problem(s): None.   PCP is Benedetta Bradley, MD.  No Known Allergies  Review of Systems: Negative except as noted in the HPI.  Objective: No changes noted in today's physical examination. There were no vitals filed for this visit. Kenneth Gonzalez is a pleasant 83 y.o. male in NAD. AAO x 3.  Vascular Examination: CFT <3 seconds b/l. DP/PT pulses faintly palpable b/l. Skin temperature gradient warm to warm b/l. No pain with calf compression. No ischemia or gangrene. No cyanosis or clubbing noted b/l. No edema noted b/l LE.   Neurological Examination: Protective sensation diminished with 10g monofilament b/l. Vibratory sensation diminished b/l.  Dermatological Examination: Pedal skin warm and supple b/l.   No open wounds. No interdigital macerations.  Toenails 1-5 b/l thick, discolored, elongated with subungual debris and pain on dorsal palpation.    Hyperkeratotic lesion(s) sub 5th met base b/l feet.  No erythema, no edema, no drainage, no fluctuance.  Musculoskeletal Examination: Muscle strength 5/5 to all lower extremity muscle groups  bilaterally. 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  Radiographs: None  Assessment/Plan: 1. 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 Callus   3. Diabetic polyneuropathy associated with type 2 diabetes mellitus (HCC)   Patient was evaluated and treated. All patient's and/or POA's questions/concerns addressed on today's visit. Mycotic toenails 1-5 debrided in length and girth without incident. Callus(es) sub 5th met base of both feet pared with sharp debridement without incident. Continue daily foot inspections and monitor blood glucose per PCP/Endocrinologist's recommendations. Continue soft, supportive shoe gear daily. Report any pedal injuries to medical professional. Call office if there are any questions/concerns. -Patient/POA to call should there be question/concern in the interim.   Return in about 3 months (around 02/12/2024).  Kenneth Gonzalez, DPM      [facility] LOCATION: [address] [place] [address]                   Office [phone]   [facility] LOCATION: [address] [place], [place] [address] Office [phone]

## 2023-11-20 ENCOUNTER — Telehealth: Payer: Self-pay

## 2023-11-20 NOTE — Telephone Encounter (Signed)
 Auth approval found on admin desk Ref# [id number] [employer] valid from 3/14-6/15/25  Items on order will call for delivery appt when in  Britton Cane Cped, CFo, CFm

## 2023-12-06 ENCOUNTER — Ambulatory Visit (INDEPENDENT_AMBULATORY_CARE_PROVIDER_SITE_OTHER)

## 2023-12-06 DIAGNOSIS — Q6671 Congenital pes cavus, right foot: Secondary | ICD-10-CM

## 2023-12-06 DIAGNOSIS — M25372 Other instability, left ankle: Secondary | ICD-10-CM | POA: Diagnosis not present

## 2023-12-06 DIAGNOSIS — Q6672 Congenital pes cavus, left foot: Secondary | ICD-10-CM | POA: Diagnosis not present

## 2023-12-06 DIAGNOSIS — M25371 Other instability, right ankle: Secondary | ICD-10-CM | POA: Diagnosis not present

## 2024-01-04 ENCOUNTER — Telehealth: Payer: Self-pay

## 2024-01-04 NOTE — Telephone Encounter (Signed)
 Pts wife called.. his brace doesn't hold I'm up and he leans with it on

## 2024-01-22 ENCOUNTER — Encounter: Payer: Self-pay | Admitting: Podiatry

## 2024-01-22 ENCOUNTER — Ambulatory Visit: Payer: Medicare PPO | Admitting: Podiatry

## 2024-01-22 DIAGNOSIS — E782 Mixed hyperlipidemia: Secondary | ICD-10-CM | POA: Insufficient documentation

## 2024-01-22 DIAGNOSIS — I10 Essential (primary) hypertension: Secondary | ICD-10-CM | POA: Insufficient documentation

## 2024-01-22 DIAGNOSIS — M2041 Other hammer toe(s) (acquired), right foot: Secondary | ICD-10-CM

## 2024-01-22 DIAGNOSIS — M79674 Pain in right toe(s): Secondary | ICD-10-CM

## 2024-01-22 DIAGNOSIS — M79675 Pain in left toe(s): Secondary | ICD-10-CM | POA: Diagnosis not present

## 2024-01-22 DIAGNOSIS — E119 Type 2 diabetes mellitus without complications: Secondary | ICD-10-CM | POA: Diagnosis not present

## 2024-01-22 DIAGNOSIS — G40909 Epilepsy, unspecified, not intractable, without status epilepticus: Secondary | ICD-10-CM | POA: Insufficient documentation

## 2024-01-22 DIAGNOSIS — E1142 Type 2 diabetes mellitus with diabetic polyneuropathy: Secondary | ICD-10-CM | POA: Diagnosis not present

## 2024-01-22 DIAGNOSIS — I872 Venous insufficiency (chronic) (peripheral): Secondary | ICD-10-CM | POA: Insufficient documentation

## 2024-01-22 DIAGNOSIS — Q6672 Congenital pes cavus, left foot: Secondary | ICD-10-CM

## 2024-01-22 DIAGNOSIS — B351 Tinea unguium: Secondary | ICD-10-CM | POA: Diagnosis not present

## 2024-01-22 DIAGNOSIS — L84 Corns and callosities: Secondary | ICD-10-CM

## 2024-01-22 DIAGNOSIS — I77811 Abdominal aortic ectasia: Secondary | ICD-10-CM | POA: Insufficient documentation

## 2024-01-22 DIAGNOSIS — Q6671 Congenital pes cavus, right foot: Secondary | ICD-10-CM | POA: Diagnosis not present

## 2024-01-22 DIAGNOSIS — F419 Anxiety disorder, unspecified: Secondary | ICD-10-CM | POA: Insufficient documentation

## 2024-01-22 DIAGNOSIS — J329 Chronic sinusitis, unspecified: Secondary | ICD-10-CM | POA: Insufficient documentation

## 2024-01-22 DIAGNOSIS — M2042 Other hammer toe(s) (acquired), left foot: Secondary | ICD-10-CM | POA: Diagnosis not present

## 2024-01-22 DIAGNOSIS — J301 Allergic rhinitis due to pollen: Secondary | ICD-10-CM | POA: Insufficient documentation

## 2024-01-22 DIAGNOSIS — J3089 Other allergic rhinitis: Secondary | ICD-10-CM | POA: Insufficient documentation

## 2024-01-27 ENCOUNTER — Encounter: Payer: Self-pay | Admitting: Podiatry

## 2024-01-27 NOTE — Progress Notes (Signed)
  Subjective:  Patient ID: Kenneth Gonzalez, male    DOB: [birth year],  MRN: [id number]  Kenneth Gonzalez presents to clinic today for for annual diabetic foot examination, at risk foot care with history of diabetic neuropathy, and callus(es) of both feet and painful mycotic toenails that are difficult to trim. Painful toenails interfere with ambulation. Aggravating factors include wearing enclosed shoe gear. Pain is relieved with periodic professional debridement. Painful calluses are aggravated when weightbearing with and without shoegear. Pain is relieved with periodic professional debridement.  Chief Complaint  Patient presents with   Diabetes    DFC NIDDM A1C? 149 CBG. Toenail trim   New problem(s): None.   PCP is Charlott Dorn LABOR, MD.  No Known Allergies  Review of Systems: Negative except as noted in the HPI.  Objective: No changes noted in today's physical examination. There were no vitals filed for this visit. Kenneth Gonzalez is a pleasant 83 y.o. male WD, WN in NAD. AAO x 3.  Vascular Examination: CFT <3 seconds b/l. DP/PT pulses faintly palpable b/l. Skin temperature gradient warm to warm b/l. No pain with calf compression. No ischemia or gangrene. No cyanosis or clubbing noted b/l. No edema noted b/l LE.   Neurological Examination: Protective sensation diminished with 10g monofilament b/l. Vibratory sensation diminished b/l.  Dermatological Examination: Pedal skin warm and supple b/l.   No open wounds. No interdigital macerations.  Toenails 1-5 b/l thick, discolored, elongated with subungual debris and pain on dorsal palpation.    Hyperkeratotic lesion(s) 5th met base of both feet.  No erythema, no edema, no drainage, no fluctuance.  Musculoskeletal Examination: Muscle strength 5/5 to all lower extremity muscle groups bilaterally. Wearing [place]  braces b/l. 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 Pes cavus deformity noted b/l feet.  Radiographs:  None  Assessment/Plan: 1. 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 Callus   3. Acquired hammertoes of both feet   4. Pes cavus of both feet   5. Diabetic polyneuropathy associated with type 2 diabetes mellitus (HCC)   6. Encounter for diabetic foot exam (HCC)     Diabetic foot examination performed today. All patient's and/or POA's questions/concerns addressed on today's visit. Toenails 1-5 debrided in length and girth without incident. Callus(es) 5th met base b/l lower extremities pared with sharp debridement without incident. Continue daily foot inspections and monitor blood glucose per PCP/Endocrinologist's recommendations.Continue soft, supportive shoe gear daily. Report any pedal injuries to medical professional. Call office if there are any questions/concerns. -Patient/POA to call should there be question/concern in the interim.   Return in about 3 months (around 04/23/2024).  Kenneth Gonzalez, DPM      [facility] LOCATION: [address], [place] [address]                   Office [phone]   [facility] LOCATION: [address] [place], [place] [address] Office [phone]

## 2024-02-05 NOTE — Progress Notes (Signed)
 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 Brace adds support and stability to ankle and will also help to slow progression of ankle deformity.  Patient reviewed with me instructions for break-in and wear. Written instructions given to patient.  Patient will follow up as needed.  Lolita Schultze CPed, CFo, CFm

## 2024-02-18 DIAGNOSIS — I77811 Abdominal aortic ectasia: Secondary | ICD-10-CM | POA: Diagnosis not present

## 2024-02-18 DIAGNOSIS — E782 Mixed hyperlipidemia: Secondary | ICD-10-CM | POA: Diagnosis not present

## 2024-02-18 DIAGNOSIS — Z0000 Encounter for general adult medical examination without abnormal findings: Secondary | ICD-10-CM | POA: Diagnosis not present

## 2024-02-18 DIAGNOSIS — Z23 Encounter for immunization: Secondary | ICD-10-CM | POA: Diagnosis not present

## 2024-02-18 DIAGNOSIS — G250 Essential tremor: Secondary | ICD-10-CM | POA: Diagnosis not present

## 2024-02-18 DIAGNOSIS — Z79899 Other long term (current) drug therapy: Secondary | ICD-10-CM | POA: Diagnosis not present

## 2024-02-18 DIAGNOSIS — G40909 Epilepsy, unspecified, not intractable, without status epilepticus: Secondary | ICD-10-CM | POA: Diagnosis not present

## 2024-02-18 DIAGNOSIS — Z1331 Encounter for screening for depression: Secondary | ICD-10-CM | POA: Diagnosis not present

## 2024-02-18 DIAGNOSIS — E1169 Type 2 diabetes mellitus with other specified complication: Secondary | ICD-10-CM | POA: Diagnosis not present

## 2024-02-18 DIAGNOSIS — I10 Essential (primary) hypertension: Secondary | ICD-10-CM | POA: Diagnosis not present

## 2024-02-18 DIAGNOSIS — J329 Chronic sinusitis, unspecified: Secondary | ICD-10-CM | POA: Diagnosis not present

## 2024-05-09 DIAGNOSIS — I10 Essential (primary) hypertension: Secondary | ICD-10-CM | POA: Diagnosis not present

## 2024-05-13 ENCOUNTER — Encounter: Payer: Self-pay | Admitting: Podiatry

## 2024-05-13 ENCOUNTER — Ambulatory Visit: Admitting: Podiatry

## 2024-05-13 DIAGNOSIS — M79674 Pain in right toe(s): Secondary | ICD-10-CM | POA: Diagnosis not present

## 2024-05-13 DIAGNOSIS — B351 Tinea unguium: Secondary | ICD-10-CM | POA: Diagnosis not present

## 2024-05-13 DIAGNOSIS — E1142 Type 2 diabetes mellitus with diabetic polyneuropathy: Secondary | ICD-10-CM

## 2024-05-13 DIAGNOSIS — M79675 Pain in left toe(s): Secondary | ICD-10-CM | POA: Diagnosis not present

## 2024-05-14 ENCOUNTER — Telehealth: Payer: Self-pay

## 2024-05-14 NOTE — Telephone Encounter (Signed)
 Patient was seen yesterday and at check out had some questions about shoes he had ordered months back. Stated he had left messeages. Could you please give patient a call. Thank you

## 2024-05-18 NOTE — Progress Notes (Signed)
  Subjective:  Patient ID: Kenneth Gonzalez, male    DOB: [birth year],  MRN: [id number]  Kenneth Gonzalez presents to clinic today for at risk foot care with history of diabetic neuropathy and painful mycotic toenails of both feet that are difficult to trim. Pain interferes with daily activities and wearing enclosed shoe gear comfortably. He is accompanied by his wife on today's visit. Chief Complaint  Patient presents with   Diabetes    DFC NIDDM A1C ?SABRA Toenail trim. LOV with PCP 02/18/24.   New problem(s): None.   PCP is Charlott Dorn LABOR, MD.  No Known Allergies  Review of Systems: Negative except as noted in the HPI.  Objective: No changes noted in today's physical examination. There were no vitals filed for this visit. Kenneth Gonzalez is a pleasant 83 y.o. male in NAD. AAO x 3.  Vascular Examination: CFT <3 seconds b/l. DP/PT pulses faintly palpable b/l. Skin temperature gradient warm to warm b/l. No pain with calf compression. No ischemia or gangrene. No cyanosis or clubbing noted b/l. No edema noted b/l LE.   Neurological Examination: Protective sensation diminished with 10g monofilament b/l. Vibratory sensation diminished b/l.  Dermatological Examination: Pedal skin warm and supple b/l.   No open wounds. No interdigital macerations.  Toenails 1-5 b/l thick, discolored, elongated with subungual debris and pain on dorsal palpation.    Minimal hyperkeratos(is/es) noted sub 5th met base b/l feet.  Musculoskeletal Examination: Muscle strength 5/5 to all lower extremity muscle groups bilaterally. Wearing [place]  braces b/l. 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 Pes cavus deformity noted b/l feet.  Radiographs: None  Assessment/Plan: 1. 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 Diabetic polyneuropathy associated with type 2 diabetes mellitus (HCC)   Patient was evaluated and treated. All patient's and/or POA's questions/concerns addressed on today's  visit. Mycotic toenails 1-5 b/l debrided in length and girth without incident. Treatment was provided by assistant Toya Badger-Nathaniel and then by myself. Continue daily foot inspections and monitor blood glucose per PCP/Endocrinologist's recommendations.Continue soft, supportive shoe gear daily. Report any pedal injuries to medical professional. Call office if there are any questions/concerns. Return in about 3 months (around 08/13/2024).  Kenneth Gonzalez, DPM      [facility] LOCATION: [address], [place] [address]                   Office [phone]   [facility] LOCATION: [address] [place], [place] [address] Office [phone]

## 2024-05-25 DIAGNOSIS — M1990 Unspecified osteoarthritis, unspecified site: Secondary | ICD-10-CM | POA: Diagnosis not present

## 2024-05-25 DIAGNOSIS — G40909 Epilepsy, unspecified, not intractable, without status epilepticus: Secondary | ICD-10-CM | POA: Diagnosis not present

## 2024-05-25 DIAGNOSIS — N400 Enlarged prostate without lower urinary tract symptoms: Secondary | ICD-10-CM | POA: Diagnosis not present

## 2024-05-25 DIAGNOSIS — N1831 Chronic kidney disease, stage 3a: Secondary | ICD-10-CM | POA: Diagnosis not present

## 2024-05-25 DIAGNOSIS — E1142 Type 2 diabetes mellitus with diabetic polyneuropathy: Secondary | ICD-10-CM | POA: Diagnosis not present

## 2024-05-25 DIAGNOSIS — E785 Hyperlipidemia, unspecified: Secondary | ICD-10-CM | POA: Diagnosis not present

## 2024-05-25 DIAGNOSIS — E1122 Type 2 diabetes mellitus with diabetic chronic kidney disease: Secondary | ICD-10-CM | POA: Diagnosis not present

## 2024-05-25 DIAGNOSIS — I719 Aortic aneurysm of unspecified site, without rupture: Secondary | ICD-10-CM | POA: Diagnosis not present

## 2024-05-25 DIAGNOSIS — Z8249 Family history of ischemic heart disease and other diseases of the circulatory system: Secondary | ICD-10-CM | POA: Diagnosis not present

## 2024-06-02 ENCOUNTER — Other Ambulatory Visit

## 2024-06-03 DIAGNOSIS — R55 Syncope and collapse: Secondary | ICD-10-CM | POA: Diagnosis not present

## 2024-06-03 DIAGNOSIS — G40909 Epilepsy, unspecified, not intractable, without status epilepticus: Secondary | ICD-10-CM | POA: Diagnosis not present

## 2024-06-03 DIAGNOSIS — Z79899 Other long term (current) drug therapy: Secondary | ICD-10-CM | POA: Diagnosis not present

## 2024-06-03 DIAGNOSIS — E1169 Type 2 diabetes mellitus with other specified complication: Secondary | ICD-10-CM | POA: Diagnosis not present

## 2024-06-03 DIAGNOSIS — I10 Essential (primary) hypertension: Secondary | ICD-10-CM | POA: Diagnosis not present

## 2024-06-03 DIAGNOSIS — J329 Chronic sinusitis, unspecified: Secondary | ICD-10-CM | POA: Diagnosis not present

## 2024-06-09 DIAGNOSIS — H02052 Trichiasis without entropian right lower eyelid: Secondary | ICD-10-CM | POA: Diagnosis not present

## 2024-08-27 ENCOUNTER — Ambulatory Visit: Admitting: Podiatry
